# Patient Record
Sex: Male | Born: 1960 | Race: White | Hispanic: No | Marital: Married | Smoking: Current every day smoker
Health system: Southern US, Community
[De-identification: ages and names within clinical notes are randomized; demographics above are authoritative.]

## PROBLEM LIST (undated history)

## (undated) DIAGNOSIS — C801 Malignant (primary) neoplasm, unspecified: Secondary | ICD-10-CM

## (undated) DIAGNOSIS — M542 Cervicalgia: Secondary | ICD-10-CM

## (undated) DIAGNOSIS — F17200 Nicotine dependence, unspecified, uncomplicated: Secondary | ICD-10-CM

## (undated) DIAGNOSIS — G8929 Other chronic pain: Secondary | ICD-10-CM

## (undated) DIAGNOSIS — R51 Headache: Secondary | ICD-10-CM

## (undated) DIAGNOSIS — R519 Headache, unspecified: Secondary | ICD-10-CM

## (undated) DIAGNOSIS — Z95828 Presence of other vascular implants and grafts: Secondary | ICD-10-CM

## (undated) DIAGNOSIS — F419 Anxiety disorder, unspecified: Secondary | ICD-10-CM

## (undated) DIAGNOSIS — J449 Chronic obstructive pulmonary disease, unspecified: Secondary | ICD-10-CM

## (undated) DIAGNOSIS — F410 Panic disorder [episodic paroxysmal anxiety] without agoraphobia: Secondary | ICD-10-CM

## (undated) DIAGNOSIS — F1190 Opioid use, unspecified, uncomplicated: Secondary | ICD-10-CM

## (undated) DIAGNOSIS — M1990 Unspecified osteoarthritis, unspecified site: Secondary | ICD-10-CM

## (undated) HISTORY — PX: FRACTURE SURGERY: SHX138

## (undated) HISTORY — DX: Malignant (primary) neoplasm, unspecified: C80.1

## (undated) HISTORY — PX: NECK SURGERY: SHX720

## (undated) HISTORY — PX: SPLENECTOMY, TOTAL: SHX788

---
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
## 2004-06-16 ENCOUNTER — Encounter: Admission: RE | Admit: 2004-06-16 | Discharge: 2004-09-14 | Payer: Self-pay

## 2010-07-20 ENCOUNTER — Emergency Department (HOSPITAL_COMMUNITY): Admission: EM | Admit: 2010-07-20 | Discharge: 2010-07-20 | Payer: Self-pay | Admitting: Emergency Medicine

## 2010-07-20 DIAGNOSIS — M961 Postlaminectomy syndrome, not elsewhere classified: Secondary | ICD-10-CM | POA: Insufficient documentation

## 2010-11-01 ENCOUNTER — Emergency Department (HOSPITAL_COMMUNITY)
Admission: EM | Admit: 2010-11-01 | Discharge: 2010-11-01 | Payer: Self-pay | Source: Home / Self Care | Admitting: Emergency Medicine

## 2010-12-23 LAB — DIFFERENTIAL
Basophils Absolute: 0.1 10*3/uL (ref 0.0–0.1)
Eosinophils Relative: 2 % (ref 0–5)
Lymphocytes Relative: 41 % (ref 12–46)
Lymphs Abs: 5.8 10*3/uL — ABNORMAL HIGH (ref 0.7–4.0)
Monocytes Absolute: 1.1 10*3/uL — ABNORMAL HIGH (ref 0.1–1.0)
Monocytes Relative: 8 % (ref 3–12)

## 2010-12-23 LAB — BASIC METABOLIC PANEL
BUN: 12 mg/dL (ref 6–23)
Chloride: 108 mEq/L (ref 96–112)
Potassium: 3.9 mEq/L (ref 3.5–5.1)

## 2010-12-23 LAB — CBC
HCT: 42.9 % (ref 39.0–52.0)
Hemoglobin: 14.7 g/dL (ref 13.0–17.0)
MCV: 99.2 fL (ref 78.0–100.0)
RDW: 13.5 % (ref 11.5–15.5)
WBC: 14.1 10*3/uL — ABNORMAL HIGH (ref 4.0–10.5)

## 2010-12-23 LAB — POCT CARDIAC MARKERS: Troponin i, poc: <0.05 ng/mL (ref 0.00–0.09)

## 2011-02-02 DIAGNOSIS — IMO0002 Reserved for concepts with insufficient information to code with codable children: Secondary | ICD-10-CM | POA: Insufficient documentation

## 2011-02-02 DIAGNOSIS — H5440 Blindness, one eye, unspecified eye: Secondary | ICD-10-CM | POA: Insufficient documentation

## 2011-02-02 DIAGNOSIS — Z9081 Acquired absence of spleen: Secondary | ICD-10-CM | POA: Insufficient documentation

## 2011-02-02 DIAGNOSIS — Z79899 Other long term (current) drug therapy: Secondary | ICD-10-CM | POA: Insufficient documentation

## 2011-04-28 DIAGNOSIS — M5137 Other intervertebral disc degeneration, lumbosacral region: Secondary | ICD-10-CM | POA: Insufficient documentation

## 2011-04-28 DIAGNOSIS — M51379 Other intervertebral disc degeneration, lumbosacral region without mention of lumbar back pain or lower extremity pain: Secondary | ICD-10-CM | POA: Insufficient documentation

## 2012-02-14 DIAGNOSIS — W108XXA Fall (on) (from) other stairs and steps, initial encounter: Secondary | ICD-10-CM | POA: Insufficient documentation

## 2012-02-14 DIAGNOSIS — Z79899 Other long term (current) drug therapy: Secondary | ICD-10-CM | POA: Insufficient documentation

## 2012-02-14 DIAGNOSIS — S0003XA Contusion of scalp, initial encounter: Secondary | ICD-10-CM | POA: Insufficient documentation

## 2012-02-15 ENCOUNTER — Emergency Department (HOSPITAL_COMMUNITY): Payer: Medicare Other

## 2012-02-15 ENCOUNTER — Emergency Department (HOSPITAL_COMMUNITY)
Admission: EM | Admit: 2012-02-15 | Discharge: 2012-02-15 | Disposition: A | Discharge status: Home / Self Care | Payer: Medicare Other | Attending: Emergency Medicine | Admitting: Emergency Medicine

## 2012-02-15 ENCOUNTER — Encounter (HOSPITAL_COMMUNITY): Payer: Self-pay | Admitting: *Deleted

## 2012-02-15 DIAGNOSIS — S1093XA Contusion of unspecified part of neck, initial encounter: Secondary | ICD-10-CM

## 2012-02-15 MED ORDER — NAPROXEN 500 MG PO TABS: 500.0000 mg | ORAL_TABLET | Freq: Two times a day (BID) | ORAL | Status: DC

## 2012-02-15 MED ORDER — KETOROLAC TROMETHAMINE 60 MG/2ML IM SOLN
60.0000 mg | Freq: Once | INTRAMUSCULAR | Status: AC
  Administered 2012-02-15: 60 mg via INTRAMUSCULAR
  Filled 2012-02-15: qty 2
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
## 2012-02-15 NOTE — ED Notes (Signed)
Gave patient cup of ice chips as requested.

## 2012-02-15 NOTE — ED Notes (Signed)
RX with patient label provided to patient by Dr Hyacinth Meeker - (electronic printer not working.  )

## 2012-02-15 NOTE — ED Provider Notes (Signed)
History     CSN: [id number]  Arrival date & time 02/14/12  2345   First MD Initiated Contact with Patient 02/15/12 0010      Chief Complaint  Patient presents with  . Fall    (Consider location/radiation/quality/duration/timing/severity/associated sxs/prior treatment) HPI Comments: Neck pain on the L anterior / lateral neck that occurred when he slipped and fell down several steps just pta - pain was acute in onset, constant, moderate and worse with palpation - has no headache, / head injury, change in vision, weakness, numbness, ataxia, back pain or other injuries.  Has been ambulatory without difficulty.  Has hx of prior neck surgery with hardware.  Patient is a 51 y.o. male presenting with fall. The history is provided by the patient and the spouse.  Fall Pertinent negatives include no headaches.    History reviewed. No pertinent past medical history.  Past Surgical History  Procedure Date  . Neck surgery   . Splenectomy, total     No family history on file.  History  Substance Use Topics  . Smoking status: Current Some Day Smoker  . Smokeless tobacco: Not on file  . Alcohol Use: No      Review of Systems  HENT: Positive for neck pain.   Cardiovascular: Negative for chest pain and leg swelling.  Musculoskeletal: Negative for back pain.  Skin: Negative for wound.  Neurological: Negative for headaches.    Allergies  Review of patient's allergies indicates no known allergies.  Home Medications   Current Outpatient Rx  Name Route Sig Dispense Refill  . OXYCONTIN PO Oral Take by mouth.    Marland Kitchen NAPROXEN 500 MG PO TABS Oral Take 1 tablet (500 mg total) by mouth 2 (two) times daily with a meal. 30 tablet 0    BP 148/90  Pulse 86  Temp(Src) 97.8 F (36.6 C) (Oral)  Resp 20  Ht 6\' 1"  (1.854 m)  Wt 175 lb (79.379 kg)  BMI 23.09 kg/m2  SpO2 99%  Physical Exam  Nursing note and vitals reviewed. Constitutional: He appears well-developed and well-nourished. No  distress.  HENT:       NCAT, has mild pain to palpation on the L side of the neck, normal open mouth, no trismus, malocclusion, racoon eyes or battle's sign  Eyes: Conjunctivae are normal. No scleral icterus.  Neck:       No posterior ttp, mild ttp over teh lateral neck / mild bruising present.  No bruit on the L over injury site / vasc site  Cardiovascular: Normal rate, regular rhythm, normal heart sounds and intact distal pulses.   Pulmonary/Chest: Effort normal and breath sounds normal. No respiratory distress. He has no wheezes.  Abdominal: Soft. He exhibits no distension. There is no tenderness.  Musculoskeletal: Normal range of motion. He exhibits tenderness ( only as desxcribed on neck, no back ttp). He exhibits no edema.  Neurological: He is alert. No cranial nerve deficit. Coordination normal.       Normal gait, normal strenght and sensation to the bil UE's.  Skin: Skin is warm and dry. No rash noted. He is not diaphoretic.    ED Course  Procedures (including critical care time)  Labs Reviewed - No data to display Dg Cervical Spine Complete  02/15/2012  *RADIOLOGY REPORT*  Clinical Data: Status post fall; neck pain.  CERVICAL SPINE - COMPLETE 4+ VIEW  Comparison: CT of the cervical spine performed 11/01/2010  Findings: There is no evidence of fracture or subluxation.  The  patient is status post cervical spinal fusion from C3-T1. Vertebral bodies demonstrate normal height and alignment. Intervertebral disc spaces are preserved.  Prevertebral soft tissues are within normal limits.  The provided odontoid view demonstrates no significant abnormality.  The visualized lung apices are clear.  IMPRESSION: No evidence of fracture or subluxation along the cervical spine; status post cervical spinal fusion from C3-T1.  Original Report Authenticated By: Tonia Ghent, M.D.     1. Contusion of neck       MDM  Focal neck injury, no other signs or c/o trauma.  Immobilize with philly c collar,  imaging, toradol IM.  No focal neurologic deficits, normal strength and sensation, x-rays reviewed as above.  X-rays negative, patient will be discharged in stable condition to follow up with family doctor.  Discharge Prescriptions include:  naprosyn  Vida Roller, MD 02/15/12 [phone]

## 2012-02-15 NOTE — ED Notes (Signed)
Pt reports he slipped and fell down some wooden steps tonight "jarring" his neck, reports metal rods already in his neck and now neck pain is worse

## 2012-02-15 NOTE — Discharge Instructions (Signed)

## 2012-04-28 DIAGNOSIS — J189 Pneumonia, unspecified organism: Secondary | ICD-10-CM

## 2012-06-01 ENCOUNTER — Emergency Department (HOSPITAL_COMMUNITY): Payer: Medicare Other

## 2012-06-01 ENCOUNTER — Emergency Department (HOSPITAL_COMMUNITY)
Admission: EM | Admit: 2012-06-01 | Discharge: 2012-06-01 | Disposition: A | Discharge status: Home / Self Care | Payer: Medicare Other | Attending: Emergency Medicine | Admitting: Emergency Medicine

## 2012-06-01 ENCOUNTER — Encounter (HOSPITAL_COMMUNITY): Payer: Self-pay

## 2012-06-01 DIAGNOSIS — R51 Headache: Secondary | ICD-10-CM | POA: Insufficient documentation

## 2012-06-01 DIAGNOSIS — F17200 Nicotine dependence, unspecified, uncomplicated: Secondary | ICD-10-CM | POA: Insufficient documentation

## 2012-06-01 DIAGNOSIS — W108XXA Fall (on) (from) other stairs and steps, initial encounter: Secondary | ICD-10-CM | POA: Insufficient documentation

## 2012-06-01 DIAGNOSIS — M549 Dorsalgia, unspecified: Secondary | ICD-10-CM | POA: Insufficient documentation

## 2012-06-01 DIAGNOSIS — W19XXXA Unspecified fall, initial encounter: Secondary | ICD-10-CM

## 2012-06-01 LAB — URINALYSIS, ROUTINE W REFLEX MICROSCOPIC
Bilirubin Urine: NEGATIVE
Glucose, UA: NEGATIVE mg/dL
Hgb urine dipstick: NEGATIVE
Ketones, ur: NEGATIVE mg/dL
Protein, ur: NEGATIVE mg/dL

## 2012-06-01 LAB — RAPID URINE DRUG SCREEN, HOSP PERFORMED
Amphetamines: NOT DETECTED
Benzodiazepines: NOT DETECTED
Cocaine: NOT DETECTED
Opiates: POSITIVE — AB

## 2012-06-01 MED ORDER — KETOROLAC TROMETHAMINE 60 MG/2ML IM SOLN
60.0000 mg | Freq: Once | INTRAMUSCULAR | Status: AC
  Administered 2012-06-01: 60 mg via INTRAMUSCULAR
  Filled 2012-06-01: qty 2

## 2012-06-01 NOTE — ED Notes (Signed)
Patients upset about treatment received at this facility.  Advised MD has informed them the xrays and CT do not show a fracture, and he is advised to use anti-inflammatory and follow up with his family doctors  Zachary Dickerson or Dr Lacie Scotts.

## 2012-06-01 NOTE — ED Provider Notes (Signed)
History     CSN: [id number]  Arrival date & time 06/01/12  1649   First MD Initiated Contact with Patient 06/01/12 1702      Chief Complaint  Patient presents with  . Fall    (Consider location/radiation/quality/duration/timing/severity/associated sxs/prior treatment) HPI Patient complains of diffuse back pain after falling down 18 steps 2 days ago. He denies loss of consciousness. He denies any numbness or tingling. He denies any injury to his chest abdomen or extremities. He states there is bruising and swelling in the mid back area. History reviewed. No pertinent past medical history.  Past Surgical History  Procedure Date  . Neck surgery   . Splenectomy, total     No family history on file.  History  Substance Use Topics  . Smoking status: Current Some Day Smoker  . Smokeless tobacco: Not on file  . Alcohol Use: No      Review of Systems  All other systems reviewed and are negative.    Allergies  Review of patient's allergies indicates no known allergies.  Home Medications   Current Outpatient Rx  Name Route Sig Dispense Refill  . NAPROXEN 500 MG PO TABS Oral Take 1 tablet (500 mg total) by mouth 2 (two) times daily with a meal. 30 tablet 0  . OXYCONTIN PO Oral Take by mouth.      BP 129/65  Pulse 98  Temp 98.1 F (36.7 C) (Oral)  Resp 22  Ht 6' (1.829 m)  Wt 166 lb (75.297 kg)  BMI 22.51 kg/m2  SpO2 95%  Physical Exam  Nursing note and vitals reviewed. Constitutional: He is oriented to person, place, and time. He appears well-developed and well-nourished.  HENT:  Head: Normocephalic and atraumatic.  Right Ear: External ear normal.  Left Ear: External ear normal.  Nose: Nose normal.  Mouth/Throat: Oropharynx is clear and moist.  Eyes: Conjunctivae and EOM are normal. Pupils are equal, round, and reactive to light.  Neck: Normal range of motion. Neck supple.       Collar removed and exam is normal after x-rays.  Cardiovascular: Normal rate,  regular rhythm, normal heart sounds and intact distal pulses.   Pulmonary/Chest: Effort normal and breath sounds normal.  Abdominal: Soft. Bowel sounds are normal.  Musculoskeletal: Normal range of motion.       Diffuse tenderness to palpation throughout thoracic and lumbar back. Neck is mildly diffusely tender. Collar is in place. No contusion or swelling is noted on my exam.  Neurological: He is alert and oriented to person, place, and time. He has normal reflexes.       Patient has slurring of words. His strength is 5 out of 5 throughout. He is oriented that when I ask him direct questions but causes his eyes in between.  Skin: Skin is warm and dry.  Psychiatric: He has a normal mood and affect. His behavior is normal. Thought content normal.    ED Course  Procedures (including critical care time)  Labs Reviewed  URINE RAPID DRUG SCREEN (HOSP PERFORMED) - Abnormal; Notable for the following:    Opiates POSITIVE (*)     All other components within normal limits  URINALYSIS, ROUTINE W REFLEX MICROSCOPIC - Abnormal; Notable for the following:    Specific Gravity, Urine <1.005 (*)     All other components within normal limits   Dg Thoracic Spine 2 View  06/01/2012  *RADIOLOGY REPORT*  Clinical Data: Larey Seat.  Back pain.  THORACIC SPINE - 2 VIEW  Comparison:  07/16/2010.  Findings: Cervical fusion hardware is noted.  This extends down to T1.  Normal alignment of the thoracic vertebral bodies is noted. There is a stable mid thoracic compression fracture.  No new/acute fracture.  IMPRESSION:  1.  Normal alignment and no acute bony findings. 2.  Remote mid thoracic vertebral body compression fracture.   Original Report Authenticated By: P. Loralie Champagne, M.D.    Dg Lumbar Spine Complete  06/01/2012  *RADIOLOGY REPORT*  Clinical Data: Larey Seat.  Back pain.  LUMBAR SPINE - COMPLETE 4+ VIEW  Comparison: 11/01/2010.  Findings: The lateral film demonstrates normal alignment. Vertebral bodies and disc spaces  are maintained except for mild to moderate degenerative disc disease at L5-S1.  No acute bony findings.  Normal alignment of the facet joints and no pars defects.  The visualized bony pelvis in intact.  Aortic calcifications are noted.  IMPRESSION: Normal alignment and no acute bony findings. Moderate degenerative disc disease at L5-S1.   Original Report Authenticated By: P. Loralie Champagne, M.D.    Ct Head Wo Contrast  06/01/2012  *RADIOLOGY REPORT*  Clinical Data:  51 year old male status post fall down steps. Pain.  History of cervical spine surgery.  CT HEAD WITHOUT CONTRAST CT CERVICAL SPINE WITHOUT CONTRAST  Technique:  Multidetector CT imaging of the head and cervical spine was performed following the standard protocol without intravenous contrast.  Multiplanar CT image reconstructions of the cervical spine were also generated.  Comparison:  [facility] 04/28/2012 and earlier.  CT HEAD  Findings: Chronic paranasal sinus opacification and mucosal thickening.  Left maxillary superimposed mucous retention cyst. Mastoids are clear.  No focal scalp hematoma. Visualized orbit soft tissues are within normal limits.  Chronic right posterior zygomatic arch fracture. Calvarium intact.  Stable cerebral volume.  No ventriculomegaly. No midline shift, mass effect, or evidence of mass lesion.  No evidence of cortically based acute infarction identified.  No acute intracranial hemorrhage identified.  No suspicious intracranial vascular hyperdensity.  IMPRESSION: 1.  No acute traumatic injury to the brain. 2.  Cervical findings below.  CT CERVICAL SPINE  Findings: Previous cervical decompression and fusion from C3-C4 to C7-T1.  Stable cervical vertebral height and alignment. Visualized skull base is intact.  No atlanto-occipital dissociation. Cervicothoracic junction alignment is within normal limits. Posterior elements from C3 to T1 are normally aligned and solidly fused.  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  Stable evidence of mild spinal stenosis with mild mass effect on the spinal cord at this level (estimated AP thecal sac 7 mm).  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  No acute cervical fracture.  Hardware appears stable and intact.  Lung apices are clear. Right apex paraseptal emphysema.  Chronic right clavicle deformity. Visualized paraspinal soft tissues are within normal limits.  IMPRESSION: 1. No acute fracture or listhesis identified in the cervical spine. Ligamentous injury is not excluded. 2. 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   Original Report Authenticated By: Harley Hallmark, M.D.    Ct Cervical Spine Wo Contrast  06/01/2012  *RADIOLOGY REPORT*  Clinical Data:  51 year old male status post fall down steps. Pain.  History of cervical spine surgery.  CT HEAD WITHOUT CONTRAST CT CERVICAL SPINE WITHOUT CONTRAST  Technique:  Multidetector CT imaging of the head and cervical spine was performed following the standard protocol without intravenous contrast.  Multiplanar CT image reconstructions  of the cervical spine were also generated.  Comparison:  [facility] 04/28/2012 and earlier.  CT HEAD  Findings: Chronic paranasal sinus opacification and mucosal thickening.  Left maxillary superimposed mucous retention cyst. Mastoids are clear.  No focal scalp hematoma. Visualized orbit soft tissues are within normal limits.  Chronic right posterior zygomatic arch fracture. Calvarium intact.  Stable cerebral volume.  No ventriculomegaly. No midline shift, mass effect, or evidence of mass lesion.  No evidence of cortically based acute infarction identified.  No acute intracranial hemorrhage identified.  No suspicious intracranial vascular hyperdensity.  IMPRESSION: 1.  No acute traumatic injury to the brain. 2.  Cervical findings below.   CT CERVICAL SPINE  Findings: Previous cervical decompression and fusion from C3-C4 to C7-T1.  Stable cervical vertebral height and alignment. Visualized skull base is intact.  No atlanto-occipital dissociation. Cervicothoracic junction alignment is within normal limits. Posterior elements from C3 to T1 are normally aligned and solidly fused.  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  Stable evidence of mild spinal stenosis with mild mass effect on the spinal cord at this level (estimated AP thecal sac 7 mm).  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  No acute cervical fracture.  Hardware appears stable and intact.  Lung apices are clear. Right apex paraseptal emphysema.  Chronic right clavicle deformity. Visualized paraspinal soft tissues are within normal limits.  IMPRESSION: 1. No acute fracture or listhesis identified in the cervical spine. Ligamentous injury is not excluded. 2. 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   Original Report Authenticated By: Harley Hallmark, M.D.      No diagnosis found.    MDM  Patient is taking Xanax and OxyContin. He is given Toradol for pain here. Advised he can use ibuprofen for further pain management.        Hilario Quarry, MD 06/01/12 [phone]

## 2012-06-01 NOTE — ED Notes (Signed)
Pt states he was going down steps, he slipped and fell 18 steps. Complain of pain from neck down back. Collar applied

## 2013-04-23 DIAGNOSIS — R079 Chest pain, unspecified: Secondary | ICD-10-CM

## 2013-07-22 ENCOUNTER — Emergency Department (HOSPITAL_COMMUNITY)
Admission: EM | Admit: 2013-07-22 | Discharge: 2013-07-22 | Disposition: A | Discharge status: Home / Self Care | Payer: Medicare Other | Attending: Emergency Medicine | Admitting: Emergency Medicine

## 2013-07-22 ENCOUNTER — Encounter (HOSPITAL_COMMUNITY): Payer: Self-pay | Admitting: Emergency Medicine

## 2013-07-22 DIAGNOSIS — F410 Panic disorder [episodic paroxysmal anxiety] without agoraphobia: Secondary | ICD-10-CM | POA: Insufficient documentation

## 2013-07-22 DIAGNOSIS — F17200 Nicotine dependence, unspecified, uncomplicated: Secondary | ICD-10-CM | POA: Insufficient documentation

## 2013-07-22 DIAGNOSIS — M542 Cervicalgia: Secondary | ICD-10-CM | POA: Insufficient documentation

## 2013-07-22 DIAGNOSIS — Z79899 Other long term (current) drug therapy: Secondary | ICD-10-CM | POA: Insufficient documentation

## 2013-07-22 DIAGNOSIS — G8929 Other chronic pain: Secondary | ICD-10-CM | POA: Insufficient documentation

## 2013-07-22 HISTORY — DX: Anxiety disorder, unspecified: F41.9

## 2013-07-22 HISTORY — DX: Panic disorder (episodic paroxysmal anxiety): F41.0

## 2013-07-22 HISTORY — DX: Cervicalgia: M54.2

## 2013-07-22 HISTORY — DX: Other chronic pain: G89.29

## 2013-07-22 MED ORDER — METHOCARBAMOL 500 MG PO TABS
1000.0000 mg | ORAL_TABLET | Freq: Once | ORAL | Status: AC
  Administered 2013-07-22: 1000 mg via ORAL
  Filled 2013-07-22: qty 2

## 2013-07-22 MED ORDER — HYDROCODONE-ACETAMINOPHEN 5-325 MG PO TABS
2.0000 | ORAL_TABLET | Freq: Once | ORAL | Status: AC
  Administered 2013-07-22: 2 via ORAL
  Filled 2013-07-22: qty 2

## 2013-07-22 MED ORDER — KETOROLAC TROMETHAMINE 10 MG PO TABS
10.0000 mg | ORAL_TABLET | Freq: Once | ORAL | Status: AC
  Administered 2013-07-22: 10 mg via ORAL
  Filled 2013-07-22: qty 1

## 2013-07-22 NOTE — ED Notes (Signed)
Pt reports chronic neck pain, pain is worse today, denies any new or recent injury. Has no meds for pain, pt is taking xanax for his pain, pt w/ slurred speech in triage, stated he took 2 xanax pta.
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
## 2013-07-22 NOTE — ED Provider Notes (Signed)
Medical screening examination/treatment/procedure(s) were performed by non-physician practitioner and as supervising physician I was immediately available for consultation/collaboration.   Argie Applegate W Travon Crochet, MD 07/22/13 2347 

## 2015-11-09 ENCOUNTER — Ambulatory Visit (HOSPITAL_COMMUNITY): Payer: Medicare Other | Admitting: Psychology

## 2018-05-20 ENCOUNTER — Emergency Department (HOSPITAL_COMMUNITY)
Admission: EM | Admit: 2018-05-20 | Discharge: 2018-05-20 | Disposition: A | Discharge status: Home / Self Care | Payer: Medicare HMO | Attending: Emergency Medicine | Admitting: Emergency Medicine

## 2018-05-20 ENCOUNTER — Other Ambulatory Visit: Payer: Self-pay

## 2018-05-20 ENCOUNTER — Emergency Department (HOSPITAL_COMMUNITY): Payer: Medicare HMO

## 2018-05-20 ENCOUNTER — Encounter (HOSPITAL_COMMUNITY): Payer: Self-pay | Admitting: Emergency Medicine

## 2018-05-20 DIAGNOSIS — Z79899 Other long term (current) drug therapy: Secondary | ICD-10-CM | POA: Insufficient documentation

## 2018-05-20 DIAGNOSIS — R221 Localized swelling, mass and lump, neck: Secondary | ICD-10-CM

## 2018-05-20 DIAGNOSIS — F17210 Nicotine dependence, cigarettes, uncomplicated: Secondary | ICD-10-CM | POA: Insufficient documentation

## 2018-05-20 LAB — COMPREHENSIVE METABOLIC PANEL
ALBUMIN: 3.2 g/dL — AB (ref 3.5–5.0)
ALT: 12 U/L (ref 0–44)
ANION GAP: 7 (ref 5–15)
AST: 20 U/L (ref 15–41)
Alkaline Phosphatase: 111 U/L (ref 38–126)
BILIRUBIN TOTAL: 0.3 mg/dL (ref 0.3–1.2)
BUN: 27 mg/dL — ABNORMAL HIGH (ref 6–20)
CALCIUM: 9.2 mg/dL (ref 8.9–10.3)
CHLORIDE: 106 mmol/L (ref 98–111)
CO2: 23 mmol/L (ref 22–32)
CREATININE: 1.16 mg/dL (ref 0.61–1.24)
GFR calc Af Amer: >60 mL/min (ref >60)
GLUCOSE: 133 mg/dL — AB (ref 70–99)
POTASSIUM: 4.4 mmol/L (ref 3.5–5.1)
Sodium: 136 mmol/L (ref 135–145)
Total Protein: 7.2 g/dL (ref 6.5–8.1)

## 2018-05-20 MED ORDER — HYDROCODONE-ACETAMINOPHEN 5-325 MG PO TABS: 1.0000 | ORAL_TABLET | Freq: Four times a day (QID) | ORAL | 0 refills | Status: DC | PRN

## 2018-05-20 MED ORDER — NICOTINE 14 MG/24HR TD PT24
14.0000 mg | MEDICATED_PATCH | Freq: Once | TRANSDERMAL | Status: DC
  Administered 2018-05-20: 14 mg via TRANSDERMAL
  Filled 2018-05-20: qty 1

## 2018-05-20 MED ORDER — HYDROCODONE-ACETAMINOPHEN 5-325 MG PO TABS
1.0000 | ORAL_TABLET | Freq: Once | ORAL | Status: AC
  Administered 2018-05-20: 1 via ORAL
  Filled 2018-05-20: qty 1

## 2018-05-20 NOTE — ED Notes (Signed)
EDP at bedside  

## 2018-05-20 NOTE — ED Triage Notes (Signed)
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 The paperwork states to go to [facility] ED for further evaluation and treatment.

## 2018-05-20 NOTE — Discharge Instructions (Signed)
The CAT scan findings from the CT scan done at [facility] discussed with Dr. Benjamine Mola.  Call his office on Monday and set up an appointment.  The appointment can be made either in the [place] office or in his main office.  It may be more convenient to set up the appointment in the [place] office.  Take the hydrocodone as needed for pain.

## 2018-05-20 NOTE — ED Notes (Signed)
Pt did not want a hall bed

## 2018-05-20 NOTE — ED Notes (Signed)
Patient verbalizes understanding of discharge instructions. Opportunity for questioning and answers were provided. Armband removed by staff, pt discharged from ED.  

## 2018-05-20 NOTE — ED Notes (Signed)
Patient transported to X-ray 

## 2018-05-20 NOTE — ED Provider Notes (Signed)
[facility] EMERGENCY DEPARTMENT Provider Note   CSN: [id number] Arrival date & time: 05/20/18  0756     History   Chief Complaint Chief Complaint  Patient presents with  . Cancer, new diagnosis    HPI Zachary Dickerson is a 57 y.o. male.  Patient with a complaint of a neck mass on the right side for several months he thought was secondary to bee sting.  But it never really resolved.  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  Patient had basic lab work CBC and basic metabolic panel.  It appears that work-up was not done patient left AMA prior to completion of the work-up.  May have become scared by the fact that they thought that they had found cancer.  Patient is here today for additional work-up.  Patient states there is some sore throat he has some neck pain mostly on the right side.  Little bit on the left.  No headache no fevers no trouble breathing no abdominal pain.  No chest pain.     Past Medical History:  Diagnosis Date  . Anxiety   . Chronic neck pain   . Panic attack     There are no active problems to display for this patient.   Past Surgical History:  Procedure Laterality Date  . FRACTURE SURGERY    . NECK SURGERY    . NECK SURGERY    . SPLENECTOMY, TOTAL          Home Medications    Prior to Admission medications   Medication Sig Start Date End Date Taking? Authorizing Provider  ALPRAZolam Duanne Moron) 1 MG tablet Take 1 mg by mouth 3 (three) times daily.    Yes [provider]  Aspirin-Salicylamide-Caffeine (BC HEADACHE) 325-95-16 MG TABS Take 1-2 packets by mouth daily as needed (for pain).   Yes [provider]  buprenorphine (SUBUTEX) 8 MG SUBL SL tablet Place 8 mg under the tongue 3 (three) times daily before meals. 04/24/18  Yes [provider]    gabapentin (NEURONTIN) 300 MG capsule Take 300 capsules by mouth 3 (three) times daily. 04/25/18  Yes [provider]  QUEtiapine (SEROQUEL) 25 MG tablet Take 25 mg by mouth at bedtime.  07/09/13  Yes [provider]  HYDROcodone-acetaminophen (NORCO/VICODIN) 5-325 MG tablet Take 1 tablet by mouth every 6 (six) hours as needed for moderate pain. 05/20/18   Fredia Sorrow, MD    Family History No family history on file.  Social History Social History   Tobacco Use  . Smoking status: Current Some Day Smoker    Types: Cigarettes  Substance Use Topics  . Alcohol use: No  . Drug use: No     Allergies   Patient has no known allergies.   Review of Systems Review of Systems  Constitutional: Negative for chills and fever.  HENT: Positive for facial swelling and sore throat. Negative for ear pain, nosebleeds, trouble swallowing and voice change.   Eyes: Negative for visual disturbance.  Respiratory: Negative for shortness of breath.   Cardiovascular: Negative for chest pain.  Gastrointestinal: Negative for abdominal pain.  Musculoskeletal: Positive for neck pain.  Skin: Negative for rash.  Neurological: Negative for syncope and headaches.  Hematological: Does not bruise/bleed easily.  Psychiatric/Behavioral: Positive for confusion.     Physical Exam Updated Vital Signs BP 123/75  Pulse 90   Temp 97.6 F (36.4 C) (Oral)   Resp 14   SpO2 93%   Physical Exam  Constitutional: He is oriented to person, place, and time. He appears well-developed and well-nourished. No distress.  HENT:  Head: Normocephalic and atraumatic.  Mouth/Throat: Oropharynx is clear and moist.  Eyes: Pupils are equal, round, and reactive to light. Conjunctivae and EOM are normal.  Neck:  Bilateral adenopathy greater on the right posterior cervical change with a mass measuring about 3 x 5 cm.  Cardiovascular: Normal rate, regular rhythm and normal heart sounds.  Pulmonary/Chest:  Effort normal and breath sounds normal.  Abdominal: Soft. Bowel sounds are normal. There is no tenderness.  Musculoskeletal: Normal range of motion. He exhibits no edema.  Lymphadenopathy:    He has cervical adenopathy.  Neurological: He is alert and oriented to person, place, and time. No cranial nerve deficit or sensory deficit. He exhibits normal muscle tone. Coordination normal.  Questionable memory abnormality.  Skin: Skin is warm.  Nursing note and vitals reviewed.    ED Treatments / Results  Labs (all labs ordered are listed, but only abnormal results are displayed) Labs Reviewed  COMPREHENSIVE METABOLIC PANEL - Abnormal; Notable for the following components:      Result Value   Glucose, Bld 133 (*)    BUN 27 (*)    Albumin 3.2 (*)    All other components within normal limits    EKG None  Radiology Dg Chest 2 View  Result Date: 05/20/2018 CLINICAL DATA:  Bilateral neck swelling for the past month. EXAM: CHEST - 2 VIEW COMPARISON:  Thoracic spine dated 07/19/2016 and chest radiographs dated 06/18/2011. FINDINGS: Normal sized heart. Clear lungs. The lungs remain hyperexpanded with mild diffuse peribronchial thickening and accentuation of the interstitial markings. Cervical spine fixation hardware. Stable previously described T8 vertebral body compression deformity. IMPRESSION: Stable changes of COPD and chronic bronchitis. No acute abnormality. Electronically Signed   By: Claudie Revering M.D.   On: 05/20/2018 10:49    Procedures Procedures (including critical care time)  Medications Ordered in ED Medications  nicotine (NICODERM CQ - dosed in mg/24 hours) patch 14 mg (14 mg Transdermal Patch Applied 05/20/18 1058)  HYDROcodone-acetaminophen (NORCO/VICODIN) 5-325 MG per tablet 1 tablet (1 tablet Oral Given 05/20/18 1123)     Initial Impression / Assessment and Plan / ED Course  I have reviewed the triage vital signs and the nursing notes.  Pertinent labs & imaging results  that were available during my care of the patient were reviewed by me and considered in my medical decision making (see chart for details).     Patient CT scan soft tissue neck with contrast findings discussed with ear nose and throat doctor Teoh.  Most consistent with metastatic nasopharyngeal cancer.  He will see him in his office and do a biopsy we will make referrals to radiation oncology and oncology as needed.  Patient pain improved here with hydrocodone.  New prescription provided for that.  Chest x-ray without any significant findings liver function test are normal.  Labs were done yesterday at [facility] also without significant abnormalities.  Final Clinical Impressions(s) / ED Diagnoses   Final diagnoses:  Mass of lateral neck    ED Discharge Orders         Ordered    HYDROcodone-acetaminophen (NORCO/VICODIN) 5-325 MG tablet  Every 6 hours PRN     05/20/18 [place], [place],  MD 05/20/18 1342

## 2018-05-21 ENCOUNTER — Ambulatory Visit (INDEPENDENT_AMBULATORY_CARE_PROVIDER_SITE_OTHER): Payer: Medicare HMO | Admitting: Otolaryngology

## 2018-05-21 ENCOUNTER — Other Ambulatory Visit: Payer: Self-pay | Admitting: Otolaryngology

## 2018-05-21 DIAGNOSIS — D3705 Neoplasm of uncertain behavior of pharynx: Secondary | ICD-10-CM

## 2018-05-21 DIAGNOSIS — R490 Dysphonia: Secondary | ICD-10-CM

## 2018-05-21 DIAGNOSIS — R590 Localized enlarged lymph nodes: Secondary | ICD-10-CM | POA: Diagnosis not present

## 2018-05-22 ENCOUNTER — Other Ambulatory Visit: Payer: Self-pay

## 2018-05-22 ENCOUNTER — Encounter (HOSPITAL_BASED_OUTPATIENT_CLINIC_OR_DEPARTMENT_OTHER): Payer: Self-pay | Admitting: *Deleted

## 2018-05-22 NOTE — Anesthesia Preprocedure Evaluation (Addendum)
Anesthesia Evaluation  Patient identified by MRN, date of birth, ID band Patient awake    Reviewed: Allergy & Precautions, NPO status , Patient's Chart, lab work & pertinent test results  Airway Mallampati: II  TM Distance: >3 FB Neck ROM: Full    Dental no notable dental hx. (+) Edentulous Upper, Edentulous Lower   Pulmonary COPD, Current Smoker,    Pulmonary exam normal breath sounds clear to auscultation       Cardiovascular Exercise Tolerance: Good negative cardio ROS Normal cardiovascular exam Rhythm:Regular Rate:Normal     Neuro/Psych negative neurological ROS  negative psych ROS   GI/Hepatic negative GI ROS, Neg liver ROS,   Endo/Other    Renal/GU negative Renal ROS     Musculoskeletal   Abdominal   Peds  Hematology   Anesthesia Other Findings   Reproductive/Obstetrics                            Anesthesia Physical Anesthesia Plan  ASA: III  Anesthesia Plan: General   Post-op Pain Management:    Induction: Intravenous  PONV Risk Score and Plan: 3 and Treatment may vary due to age or medical condition, Ondansetron and Dexamethasone  Airway Management Planned: Oral ETT  Additional Equipment:   Intra-op Plan:   Post-operative Plan: Extubation in OR  Informed Consent: I have reviewed the patients History and Physical, chart, labs and discussed the procedure including the risks, benefits and alternatives for the proposed anesthesia with the patient or authorized representative who has indicated his/her understanding and acceptance.   Dental advisory given  Plan Discussed with: CRNA  Anesthesia Plan Comments:        Anesthesia Quick Evaluation

## 2018-05-23 ENCOUNTER — Encounter (HOSPITAL_BASED_OUTPATIENT_CLINIC_OR_DEPARTMENT_OTHER)
Admission: RE | Disposition: A | Discharge status: Home / Self Care | Payer: Self-pay | Source: Ambulatory Visit | Attending: Otolaryngology

## 2018-05-23 ENCOUNTER — Encounter (HOSPITAL_BASED_OUTPATIENT_CLINIC_OR_DEPARTMENT_OTHER): Payer: Self-pay | Admitting: Anesthesiology

## 2018-05-23 ENCOUNTER — Other Ambulatory Visit: Payer: Self-pay

## 2018-05-23 ENCOUNTER — Ambulatory Visit (HOSPITAL_BASED_OUTPATIENT_CLINIC_OR_DEPARTMENT_OTHER): Payer: Medicare HMO | Admitting: Anesthesiology

## 2018-05-23 ENCOUNTER — Ambulatory Visit (HOSPITAL_BASED_OUTPATIENT_CLINIC_OR_DEPARTMENT_OTHER)
Admission: RE | Admit: 2018-05-23 | Discharge: 2018-05-23 | Disposition: A | Discharge status: Home / Self Care | Payer: Medicare HMO | Source: Ambulatory Visit | Attending: Otolaryngology | Admitting: Otolaryngology

## 2018-05-23 DIAGNOSIS — Z79899 Other long term (current) drug therapy: Secondary | ICD-10-CM | POA: Diagnosis not present

## 2018-05-23 DIAGNOSIS — J449 Chronic obstructive pulmonary disease, unspecified: Secondary | ICD-10-CM | POA: Diagnosis not present

## 2018-05-23 DIAGNOSIS — F419 Anxiety disorder, unspecified: Secondary | ICD-10-CM | POA: Diagnosis not present

## 2018-05-23 DIAGNOSIS — C119 Malignant neoplasm of nasopharynx, unspecified: Secondary | ICD-10-CM | POA: Insufficient documentation

## 2018-05-23 DIAGNOSIS — D3705 Neoplasm of uncertain behavior of pharynx: Secondary | ICD-10-CM

## 2018-05-23 DIAGNOSIS — F17210 Nicotine dependence, cigarettes, uncomplicated: Secondary | ICD-10-CM | POA: Diagnosis not present

## 2018-05-23 HISTORY — PX: NASOPHARYNGEAL BIOPSY: SHX6488

## 2018-05-23 HISTORY — DX: Nicotine dependence, unspecified, uncomplicated: F17.200

## 2018-05-23 HISTORY — DX: Unspecified osteoarthritis, unspecified site: M19.90

## 2018-05-23 HISTORY — DX: Chronic obstructive pulmonary disease, unspecified: J44.9

## 2018-05-23 HISTORY — DX: Opioid use, unspecified, uncomplicated: F11.90

## 2018-05-23 SURGERY — BIOPSY, NASOPHARYNX
Anesthesia: General | Site: Mouth

## 2018-05-23 MED ORDER — SCOPOLAMINE 1 MG/3DAYS TD PT72: 1.0000 | MEDICATED_PATCH | Freq: Once | TRANSDERMAL | Status: DC | PRN

## 2018-05-23 MED ORDER — HYDROCODONE-ACETAMINOPHEN 5-325 MG PO TABS: 1.0000 | ORAL_TABLET | Freq: Four times a day (QID) | ORAL | 0 refills | Status: DC | PRN

## 2018-05-23 MED ORDER — ROCURONIUM BROMIDE 100 MG/10ML IV SOLN
INTRAVENOUS | Status: DC | PRN
  Administered 2018-05-23: 45 mg via INTRAVENOUS

## 2018-05-23 MED ORDER — HYDROCODONE-ACETAMINOPHEN 7.5-325 MG PO TABS: 1.0000 | ORAL_TABLET | Freq: Once | ORAL | Status: DC | PRN

## 2018-05-23 MED ORDER — FENTANYL CITRATE (PF) 100 MCG/2ML IJ SOLN
50.0000 ug | INTRAMUSCULAR | Status: DC | PRN
  Administered 2018-05-23: 100 ug via INTRAVENOUS

## 2018-05-23 MED ORDER — DEXAMETHASONE SODIUM PHOSPHATE 4 MG/ML IJ SOLN
INTRAMUSCULAR | Status: DC | PRN
  Administered 2018-05-23: 10 mg via INTRAVENOUS

## 2018-05-23 MED ORDER — BUPIVACAINE HCL (PF) 0.25 % IJ SOLN
INTRAMUSCULAR | Status: AC
  Filled 2018-05-23: qty 90

## 2018-05-23 MED ORDER — LIDOCAINE 2% (20 MG/ML) 5 ML SYRINGE
INTRAMUSCULAR | Status: AC
  Filled 2018-05-23: qty 5

## 2018-05-23 MED ORDER — SUGAMMADEX SODIUM 200 MG/2ML IV SOLN
INTRAVENOUS | Status: DC | PRN
  Administered 2018-05-23: 200 mg via INTRAVENOUS

## 2018-05-23 MED ORDER — OXYMETAZOLINE HCL 0.05 % NA SOLN
NASAL | Status: AC
  Filled 2018-05-23: qty 15

## 2018-05-23 MED ORDER — MEPERIDINE HCL 25 MG/ML IJ SOLN: 6.2500 mg | INTRAMUSCULAR | Status: DC | PRN

## 2018-05-23 MED ORDER — MIDAZOLAM HCL 2 MG/2ML IJ SOLN
INTRAMUSCULAR | Status: AC
  Filled 2018-05-23: qty 2

## 2018-05-23 MED ORDER — ONDANSETRON HCL 4 MG/2ML IJ SOLN: 4.0000 mg | Freq: Once | INTRAMUSCULAR | Status: DC | PRN

## 2018-05-23 MED ORDER — MIDAZOLAM HCL 2 MG/2ML IJ SOLN
1.0000 mg | INTRAMUSCULAR | Status: DC | PRN
  Administered 2018-05-23: 2 mg via INTRAVENOUS

## 2018-05-23 MED ORDER — GABAPENTIN 300 MG PO CAPS: 300.0000 mg | ORAL_CAPSULE | Freq: Three times a day (TID) | ORAL | 0 refills | Status: DC

## 2018-05-23 MED ORDER — OXYMETAZOLINE HCL 0.05 % NA SOLN
NASAL | Status: DC | PRN
  Administered 2018-05-23: 1

## 2018-05-23 MED ORDER — METHYLENE BLUE 0.5 % INJ SOLN
INTRAVENOUS | Status: AC
  Filled 2018-05-23: qty 10

## 2018-05-23 MED ORDER — PROPOFOL 10 MG/ML IV BOLUS
INTRAVENOUS | Status: DC | PRN
  Administered 2018-05-23: 200 mg via INTRAVENOUS

## 2018-05-23 MED ORDER — PROPOFOL 10 MG/ML IV BOLUS
INTRAVENOUS | Status: AC
  Filled 2018-05-23: qty 20

## 2018-05-23 MED ORDER — LIDOCAINE 2% (20 MG/ML) 5 ML SYRINGE
INTRAMUSCULAR | Status: DC | PRN
  Administered 2018-05-23: 100 mg via INTRAVENOUS

## 2018-05-23 MED ORDER — CEFAZOLIN SODIUM-DEXTROSE 2-3 GM-%(50ML) IV SOLR
INTRAVENOUS | Status: DC | PRN
  Administered 2018-05-23: 2 g via INTRAVENOUS

## 2018-05-23 MED ORDER — GABAPENTIN 300 MG PO CAPS: 300.0000 mg | ORAL_CAPSULE | Freq: Three times a day (TID) | ORAL | Status: DC

## 2018-05-23 MED ORDER — FENTANYL CITRATE (PF) 100 MCG/2ML IJ SOLN
INTRAMUSCULAR | Status: AC
  Filled 2018-05-23: qty 2

## 2018-05-23 MED ORDER — ACETAMINOPHEN 10 MG/ML IV SOLN: 1000.0000 mg | Freq: Once | INTRAVENOUS | Status: DC | PRN

## 2018-05-23 MED ORDER — SODIUM CHLORIDE 0.9 % IJ SOLN
INTRAMUSCULAR | Status: AC
  Filled 2018-05-23: qty 10

## 2018-05-23 MED ORDER — ONDANSETRON HCL 4 MG/2ML IJ SOLN
INTRAMUSCULAR | Status: DC | PRN
  Administered 2018-05-23: 4 mg via INTRAVENOUS

## 2018-05-23 MED ORDER — HYDROMORPHONE HCL 1 MG/ML IJ SOLN: 0.2500 mg | INTRAMUSCULAR | Status: DC | PRN

## 2018-05-23 MED ORDER — LACTATED RINGERS IV SOLN
INTRAVENOUS | Status: DC
  Administered 2018-05-23 (×2): via INTRAVENOUS

## 2018-05-23 SURGICAL SUPPLY — 30 items
CANISTER SUCT 1200ML W/VALVE (MISCELLANEOUS) ×3 IMPLANT
CATH ROBINSON RED A/P 10FR (CATHETERS) ×3 IMPLANT
CATH ROBINSON RED A/P 14FR (CATHETERS) IMPLANT
COAGULATOR SUCT 6 FR SWTCH (ELECTROSURGICAL)
COAGULATOR SUCT SWTCH 10FR 6 (ELECTROSURGICAL) IMPLANT
COVER BACK TABLE 60X90IN (DRAPES) ×3 IMPLANT
COVER MAYO STAND STRL (DRAPES) ×3 IMPLANT
ELECT REM PT RETURN 9FT ADLT (ELECTROSURGICAL)
ELECT REM PT RETURN 9FT PED (ELECTROSURGICAL)
ELECTRODE REM PT RETRN 9FT PED (ELECTROSURGICAL) IMPLANT
ELECTRODE REM PT RTRN 9FT ADLT (ELECTROSURGICAL) IMPLANT
GAUZE SPONGE 4X4 12PLY STRL LF (GAUZE/BANDAGES/DRESSINGS) ×3 IMPLANT
GLOVE BIO SURGEON STRL SZ7.5 (GLOVE) ×3 IMPLANT
GLOVE BIOGEL M STRL SZ7.5 (GLOVE) ×3 IMPLANT
GOWN STRL REUS W/ TWL LRG LVL3 (GOWN DISPOSABLE) ×1 IMPLANT
GOWN STRL REUS W/ TWL XL LVL3 (GOWN DISPOSABLE) ×1 IMPLANT
GOWN STRL REUS W/TWL LRG LVL3 (GOWN DISPOSABLE) ×2
GOWN STRL REUS W/TWL XL LVL3 (GOWN DISPOSABLE) ×2
MARKER SKIN DUAL TIP RULER LAB (MISCELLANEOUS) IMPLANT
NS IRRIG 1000ML POUR BTL (IV SOLUTION) ×3 IMPLANT
SHEET MEDIUM DRAPE 40X70 STRL (DRAPES) ×3 IMPLANT
SOLUTION BUTLER CLEAR DIP (MISCELLANEOUS) ×3 IMPLANT
SPONGE TONSIL TAPE 1 RFD (DISPOSABLE) IMPLANT
SPONGE TONSIL TAPE 1.25 RFD (DISPOSABLE) ×3 IMPLANT
SYR BULB 3OZ (MISCELLANEOUS) IMPLANT
TOWEL GREEN STERILE FF (TOWEL DISPOSABLE) ×3 IMPLANT
TUBE CONNECTING 20'X1/4 (TUBING) ×1
TUBE CONNECTING 20X1/4 (TUBING) ×2 IMPLANT
TUBE SALEM SUMP 12R W/ARV (TUBING) IMPLANT
TUBE SALEM SUMP 16 FR W/ARV (TUBING) IMPLANT

## 2018-05-23 NOTE — Anesthesia Postprocedure Evaluation (Signed)
Anesthesia Post Note  Patient: Zachary Dickerson.  Procedure(s) Performed: NASOPHARYNGEAL BIOPSY AND FROZEN SECTION (N/A Mouth)     Patient location during evaluation: PACU Anesthesia Type: General Level of consciousness: awake and alert Pain management: pain level controlled Vital Signs Assessment: post-procedure vital signs reviewed and stable Respiratory status: spontaneous breathing, nonlabored ventilation, respiratory function stable and patient connected to nasal cannula oxygen Cardiovascular status: blood pressure returned to baseline and stable Postop Assessment: no apparent nausea or vomiting Anesthetic complications: no    Last Vitals:  Vitals:   05/23/18 1252 05/23/18 1319  BP:  127/81  Pulse: 84 92  Resp: 13 16  Temp:  36.9 C  SpO2: 99% 96%    Last Pain:  Vitals:   05/23/18 1319  TempSrc: Oral  PainSc: 2                  Barnet Glasgow

## 2018-05-23 NOTE — Anesthesia Procedure Notes (Signed)
Procedure Name: Intubation Date/Time: 05/23/2018 11:46 AM Performed by: Maryella Shivers, CRNA Pre-anesthesia Checklist: Patient identified, Emergency Drugs available, Suction available and Patient being monitored Patient Re-evaluated:Patient Re-evaluated prior to induction Oxygen Delivery Method: Circle system utilized Preoxygenation: Pre-oxygenation with 100% oxygen Induction Type: IV induction Ventilation: Mask ventilation without difficulty Laryngoscope Size: Mac and 3 Tube type: Oral Tube size: 8.0 mm Number of attempts: 1 Airway Equipment and Method: Stylet and Oral airway Placement Confirmation: ETT inserted through vocal cords under direct vision,  positive ETCO2 and breath sounds checked- equal and bilateral Secured at: 22 cm Tube secured with: Tape Dental Injury: Teeth and Oropharynx as per pre-operative assessment

## 2018-05-23 NOTE — Transfer of Care (Signed)
Immediate Anesthesia Transfer of Care Note  Patient: Zachary Dickerson.  Procedure(s) Performed: NASOPHARYNGEAL BIOPSY AND FROZEN SECTION (N/A Mouth)  Patient Location: PACU  Anesthesia Type:General  Level of Consciousness: sedated  Airway & Oxygen Therapy: Patient Spontanous Breathing and Patient connected to face mask oxygen  Post-op Assessment: Report given to RN and Post -op Vital signs reviewed and stable  Post vital signs: Reviewed and stable  Last Vitals:  Vitals Value Taken Time  BP 103/83 05/23/2018 12:33 PM  Temp 37 C 05/23/2018 12:35 PM  Pulse 82 05/23/2018 12:37 PM  Resp 16 05/23/2018 12:37 PM  SpO2 100 % 05/23/2018 12:37 PM  Vitals shown include unvalidated device data.  Last Pain:  Vitals:   05/23/18 1032  TempSrc: Oral  PainSc: 6          Complications: No apparent anesthesia complications

## 2018-05-23 NOTE — Discharge Instructions (Addendum)
The patient may resume all his previous activities and diet. He will follow-up in my [place] office in one week.      Post Anesthesia Home Care Instructions  Activity: Get plenty of rest for the remainder of the day. A responsible individual must stay with you for 24 hours following the procedure.  For the next 24 hours, DO NOT: -Drive a car -Paediatric nurse -Drink alcoholic beverages -Take any medication unless instructed by your physician -Make any legal decisions or sign important papers.  Meals: Start with liquid foods such as gelatin or soup. Progress to regular foods as tolerated. Avoid greasy, spicy, heavy foods. If nausea and/or vomiting occur, drink only clear liquids until the nausea and/or vomiting subsides. Call your physician if vomiting continues.  Special Instructions/Symptoms: Your throat may feel dry or sore from the anesthesia or the breathing tube placed in your throat during surgery. If this causes discomfort, gargle with warm salt water. The discomfort should disappear within 24 hours.  If you had a scopolamine patch placed behind your ear for the management of post- operative nausea and/or vomiting:  1. The medication in the patch is effective for 72 hours, after which it should be removed.  Wrap patch in a tissue and discard in the trash. Wash hands thoroughly with soap and water. 2. You may remove the patch earlier than 72 hours if you experience unpleasant side effects which may include dry mouth, dizziness or visual disturbances. 3. Avoid touching the patch. Wash your hands with soap and water after contact with the patch.

## 2018-05-23 NOTE — H&P (Signed)
Cc: Neck mass  HPI: The patient is a 57 y/o male who presents today for evaluation of bilateral neck masses. The patient is seen in consultation requested by [facility]. The patient noted right sided neck swelling 4 weeks ago which he originally thought was from a bee sting. The swelling persisted so he went to the ER for further evaluation. CT done at [facility] showed extensive enlarged bilateral necrotic adenopathy compatible with metastatic disease. Enlarged adenoid tissue with ulceration was noted and concerning for primary nasopharyngeal carcinoma. The patient has noted unexplained weight loss in the past few months. He has experienced some throat and neck discomfort. He is a 40 + pack year smoker. No previous ENT surgery is noted.   The patient's review of systems (constitutional, eyes, ENT, cardiovascular, respiratory, GI, musculoskeletal, skin, neurologic, psychiatric, endocrine, hematologic, allergic) is noted in the ROS questionnaire.  It is reviewed with the patient.   Family health history: None.   Major events: Cervical spine  and hip fracture / surgery,.   Ongoing medical problems: Arthritis, anxiety, chest pain, COPD, panic attack, neck mass.   Social history: The patient is single. He smokes half pack of cigarettes a day. He denies the use of alcohol or illegal drugs.   Exam: General: Communicates without difficulty, well nourished, no acute distress. Head: Normocephalic, no evidence injury, no tenderness, facial buttresses intact without stepoff. Eyes: PERRL, EOMI. No scleral icterus, conjunctivae clear. Neuro: CN II exam reveals vision grossly intact.  No nystagmus at any point of gaze. Ears: Auricles well formed without lesions.  Ear canals are intact without mass or lesion.  No erythema or edema is appreciated.  The TMs are intact without fluid. Nose: External evaluation reveals normal support and skin without lesions.  Dorsum is intact.  Anterior rhinoscopy reveals  congested mucosa over anterior aspect of inferior turbinates and deviated septum.  No purulence noted. Oral:  Oral cavity and oropharynx are intact, symmetric, without erythema or edema.  Mucosa is moist without lesions. Neck: Limited range of motion without pain.  Significant lymphadenopathy. Thyroid bed within normal limits to palpation.  Parotid glands and submandibular glands equal bilaterally without mass.  Trachea is midline. Neuro:  CN 2-12 grossly intact. Gait normal. Vestibular: No nystagmus at any point of gaze.   Procedure:  Flexible Nasal Endoscopy: Risks, benefits, and alternatives of flexible endoscopy were explained to the patient.  Specific mention was made of the risk of throat numbness with difficulty swallowing, possible bleeding from the nose and mouth, and pain from the procedure.  The patient gave oral consent to proceed.  The nasal cavities were decongested and anesthetised with a combination of oxymetazoline and 4% lidocaine solution.  The flexible scope was inserted into the right nasal cavity. NSD noted. Endoscopy of the inferior and middle meatus was performed.  No polyp, mass, or lesion was appreciated.  Olfactory cleft was clear.  Ulcerative mass filling the nasopharyngeal space.  Turbinates were without mass.  The procedure was repeated on the contralateral side with similar findings.  The patient tolerated the procedure well.  Instructions were given to avoid eating or drinking for 2 hours.   Assessment  1. The patient is noted to have an ulcerative mass filling the nasopharyngeal space with multiple enlarged bilateral lymph nodes.  2. 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   Plan 1. The physical exam, nasal endoscopy, and CT findings are reviewed with the patient and his family.  2.  Differential diagnosis is also discussed at length.  3. Recommend nasopharyngeal biopsy with frozen section. The risks, benefits, alternatives,  and details of the procedure are reviewed with the patient. Questions are invited and answered.  4. The procedure will be scheduled as soon as possible so that a treatment plan can be implemented.

## 2018-05-23 NOTE — Op Note (Signed)
DATE OF PROCEDURE:  05/23/2018                              OPERATIVE REPORT  SURGEON:  Leta Baptist, MD  PREOPERATIVE DIAGNOSES: 1. Nasopharyngeal mass  POSTOPERATIVE DIAGNOSES: 1. Nasopharyngeal mass  PROCEDURE PERFORMED:  Biopsy of nasopharyngeal mass  ANESTHESIA:  General endotracheal tube anesthesia.  COMPLICATIONS:  None.  ESTIMATED BLOOD LOSS:  Minimal.  INDICATION FOR PROCEDURE:  Zachary Dickerson. is a 57 y.o. male who initially presented to the [facility] emergency room complaining of right neck mass and neck pain. His CT scan showed bilateral necrotic lymph nodes, with an ulcerative mass noted within his nasopharynx. The nasopharyngeal mass was confirmed on flexible endoscopy examination. The findings were concerning for malignancy. Based on the above findings, the decision was made for the patient to undergo the biopsy procedure.  The risks, benefits, alternatives, and details of the procedure were discussed with the patient.  Questions were invited and answered.  Informed consent was obtained.  DESCRIPTION:  The patient was taken to the operating room and placed supine on the operating table.  General endotracheal tube anesthesia was administered by the anesthesiologist.  The patient was positioned and prepped and draped in a standard fashion for Nasopharyngeal biopsy.  A Crowe-Davis mouth gag was inserted into the oral cavity for exposure. A red rubber catheter was inserted via the nasal cavity into the pharynx. The red rubber catheter was used to retract the soft palate. Indirect mirror examination of the nasal nasopharynx revealed an ulcerated fungating mass. 3 biopsy specimens were obtained using a Sinclair forceps. The specimen was sent to the pathology department for frozen section analysis. The result was consistent with malignancy. The Crowe-Davis mouth gag was removed.   The care of the patient was turned over to the anesthesiologist.  The patient was awakened from anesthesia  without difficulty.  The patient was extubated and transferred to the recovery room in good condition.  OPERATIVE FINDINGS: Nasopharyngeal mass.  SPECIMEN:  Nasopharyngeal biopsy specimens.  FOLLOWUP CARE:  The patient will be discharged home once awake and alert.    Jalayla Chrismer Cassie Freer 05/23/2018 12:33 PM

## 2018-05-24 ENCOUNTER — Encounter (HOSPITAL_BASED_OUTPATIENT_CLINIC_OR_DEPARTMENT_OTHER): Payer: Self-pay | Admitting: Otolaryngology

## 2018-05-25 ENCOUNTER — Other Ambulatory Visit (HOSPITAL_COMMUNITY): Payer: Self-pay | Admitting: Nurse Practitioner

## 2018-05-25 DIAGNOSIS — C119 Malignant neoplasm of nasopharynx, unspecified: Secondary | ICD-10-CM

## 2018-05-29 ENCOUNTER — Inpatient Hospital Stay (HOSPITAL_COMMUNITY): Payer: Medicare HMO

## 2018-05-29 ENCOUNTER — Encounter (HOSPITAL_COMMUNITY): Payer: Self-pay | Admitting: Hematology

## 2018-05-29 ENCOUNTER — Inpatient Hospital Stay (HOSPITAL_COMMUNITY): Payer: Medicare HMO | Attending: Hematology | Admitting: Hematology

## 2018-05-29 ENCOUNTER — Other Ambulatory Visit: Payer: Self-pay

## 2018-05-29 VITALS — BP 122/73 | HR 93 | Temp 97.6°F | Resp 20 | Ht 69.0 in | Wt 205.2 lb

## 2018-05-29 DIAGNOSIS — C119 Malignant neoplasm of nasopharynx, unspecified: Secondary | ICD-10-CM

## 2018-05-29 DIAGNOSIS — G8929 Other chronic pain: Secondary | ICD-10-CM | POA: Diagnosis not present

## 2018-05-29 DIAGNOSIS — M542 Cervicalgia: Secondary | ICD-10-CM | POA: Insufficient documentation

## 2018-05-29 DIAGNOSIS — J449 Chronic obstructive pulmonary disease, unspecified: Secondary | ICD-10-CM | POA: Diagnosis not present

## 2018-05-29 DIAGNOSIS — F17210 Nicotine dependence, cigarettes, uncomplicated: Secondary | ICD-10-CM | POA: Diagnosis not present

## 2018-05-29 LAB — CBC WITH DIFFERENTIAL/PLATELET
Basophils Absolute: 0.1 10*3/uL (ref 0.0–0.1)
Basophils Relative: 0 %
EOS PCT: 7 %
Eosinophils Absolute: 0.9 10*3/uL — ABNORMAL HIGH (ref 0.0–0.7)
HCT: 40.4 % (ref 39.0–52.0)
Hemoglobin: 13.3 g/dL (ref 13.0–17.0)
LYMPHS ABS: 4.2 10*3/uL — AB (ref 0.7–4.0)
LYMPHS PCT: 33 %
MCH: 31.2 pg (ref 26.0–34.0)
MCHC: 32.9 g/dL (ref 30.0–36.0)
MCV: 94.8 fL (ref 78.0–100.0)
MONO ABS: 1.6 10*3/uL — AB (ref 0.1–1.0)
MONOS PCT: 13 %
Neutro Abs: 6.1 10*3/uL (ref 1.7–7.7)
Neutrophils Relative %: 47 %
PLATELETS: 487 10*3/uL — AB (ref 150–400)
RBC: 4.26 MIL/uL (ref 4.22–5.81)
RDW: 14.8 % (ref 11.5–15.5)
WBC: 12.8 10*3/uL — ABNORMAL HIGH (ref 4.0–10.5)
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
## 2018-05-29 NOTE — Patient Instructions (Signed)
[place] at [facility] Discharge Instructions  Follow up with [place] after scans are done.   Thank you for choosing [place] at [facility] to provide your oncology and hematology care.  To afford each patient quality time with our provider, please arrive at least 15 minutes before your scheduled appointment time.   If you have a lab appointment with the [facility] please come in thru the  Main Entrance and check in at the main information desk  You need to re-schedule your appointment should you arrive 10 or more minutes late.  We strive to give you quality time with our providers, and arriving late affects you and other patients whose appointments are after yours.  Also, if you no show three or more times for appointments you may be dismissed from the clinic at the providers discretion.     Again, thank you for choosing [facility].  Our hope is that these requests will decrease the amount of time that you wait before being seen by our physicians.       _____________________________________________________________  Should you have questions after your visit to [facility], please contact our office at [phone] between the hours of 8:00 a.m. and 4:30 p.m.  Voicemails left after 4:00 p.m. will not be returned until the following business day.  For prescription refill requests, have your pharmacy contact our office and allow 72 hours.    Cancer Center Support Programs:   > Cancer Support Group  2nd Tuesday of the month 1pm-2pm, Journey Room

## 2018-05-30 ENCOUNTER — Ambulatory Visit
Admission: RE | Admit: 2018-05-30 | Discharge: 2018-05-30 | Disposition: A | Discharge status: Home / Self Care | Payer: Medicare HMO | Source: Ambulatory Visit | Attending: Nurse Practitioner | Admitting: Nurse Practitioner

## 2018-05-30 DIAGNOSIS — C119 Malignant neoplasm of nasopharynx, unspecified: Secondary | ICD-10-CM | POA: Insufficient documentation

## 2018-05-30 DIAGNOSIS — C770 Secondary and unspecified malignant neoplasm of lymph nodes of head, face and neck: Secondary | ICD-10-CM | POA: Insufficient documentation

## 2018-05-30 DIAGNOSIS — R590 Localized enlarged lymph nodes: Secondary | ICD-10-CM | POA: Insufficient documentation

## 2018-05-30 DIAGNOSIS — R918 Other nonspecific abnormal finding of lung field: Secondary | ICD-10-CM | POA: Diagnosis not present

## 2018-05-30 LAB — GLUCOSE, CAPILLARY: GLUCOSE-CAPILLARY: 98 mg/dL (ref 70–99)

## 2018-05-30 MED ORDER — FLUDEOXYGLUCOSE F - 18 (FDG) INJECTION
10.6000 | Freq: Once | INTRAVENOUS | Status: AC | PRN
  Administered 2018-05-30: 10.7 via INTRAVENOUS

## 2018-05-30 NOTE — Addendum Note (Signed)
Addended by: Gerhard Perches on: 05/30/2018 10:44 AM   Modules accepted: Orders

## 2018-05-31 ENCOUNTER — Inpatient Hospital Stay (HOSPITAL_COMMUNITY): Payer: Medicare HMO

## 2018-05-31 ENCOUNTER — Ambulatory Visit (INDEPENDENT_AMBULATORY_CARE_PROVIDER_SITE_OTHER): Payer: Medicare HMO | Admitting: Otolaryngology

## 2018-05-31 ENCOUNTER — Encounter (HOSPITAL_COMMUNITY): Payer: Self-pay | Admitting: General Practice

## 2018-05-31 DIAGNOSIS — C119 Malignant neoplasm of nasopharynx, unspecified: Secondary | ICD-10-CM | POA: Diagnosis not present

## 2018-05-31 NOTE — Progress Notes (Signed)
[place] Psychosocial Distress Screening Clinical Social Work  Clinical Social Work was referred by distress screening protocol.  The patient scored a 8 on the Psychosocial Distress Thermometer which indicates moderate distress. Clinical Social Worker contacted patient by phone to assess for distress and other psychosocial needs. Unable to reach patient, spoke w fiancee who answered phone.  "No one will tell us anything, we are left in the dark."  Caregiver was shocked by diagnosis, concerned that patient was asked about difficulty breathing "I stayed awake all night watching him breathe."  Notes that patient's demeanor has changed to more anger, irritability - "I feel like he is still in a state of shock, its always on his mind."  Notes patient is struggling w lack of information about severity of diagnosis, prognosis, treatment plan.  Discussed process of treatment team gathering information in order to properly plan treatment, noted this time is often marked by significant anxiety for patients and families.  Encouraged fiancee to connect w caregiver support group at [facility] provided contact information.    ONCBCN DISTRESS SCREENING 05/29/2018  Screening Type Initial Screening  Distress experienced in past week (1-10) 8  Practical problem type Food  Emotional problem type Nervousness/Anxiety;Adjusting to illness  Information Concerns Type Lack of info about treatment;Lack of info about diagnosis  Physical Problem type Sleep/insomnia;Breathing    Clinical Social Worker follow up needed: Yes.    If yes, follow up plan:  Call back tomorrow to speak w patient.  Beverely Pace, [place], LCSW Clinical Social Worker Phone:  [phone]

## 2018-06-03 LAB — EPSTEIN BARR VRS(EBV DNA BY PCR)
EBV DNA QN by PCR: 5779 copies/mL
log10 EBV DNA Qn PCR: 3.762 log10 copy/mL

## 2018-06-05 ENCOUNTER — Encounter (HOSPITAL_COMMUNITY): Payer: Self-pay

## 2018-06-05 ENCOUNTER — Ambulatory Visit (HOSPITAL_COMMUNITY)
Admission: RE | Admit: 2018-06-05 | Discharge: 2018-06-05 | Disposition: A | Discharge status: Home / Self Care | Payer: Medicare HMO | Source: Ambulatory Visit | Attending: Nurse Practitioner | Admitting: Nurse Practitioner

## 2018-06-05 DIAGNOSIS — C119 Malignant neoplasm of nasopharynx, unspecified: Secondary | ICD-10-CM

## 2018-06-06 ENCOUNTER — Other Ambulatory Visit (HOSPITAL_COMMUNITY): Payer: Self-pay | Admitting: Nurse Practitioner

## 2018-06-06 ENCOUNTER — Telehealth (HOSPITAL_COMMUNITY): Payer: Self-pay | Admitting: *Deleted

## 2018-06-06 ENCOUNTER — Ambulatory Visit (HOSPITAL_COMMUNITY): Payer: Medicare HMO | Admitting: Hematology

## 2018-06-06 DIAGNOSIS — C119 Malignant neoplasm of nasopharynx, unspecified: Secondary | ICD-10-CM

## 2018-06-06 MED ORDER — HYDROCODONE-ACETAMINOPHEN 5-325 MG PO TABS: 1.0000 | ORAL_TABLET | Freq: Four times a day (QID) | ORAL | 0 refills | Status: DC | PRN

## 2018-06-06 NOTE — Telephone Encounter (Signed)
Pt's wife called requesting that we call [facility] and give them permission to give Noha the Hydrocodone to take with his subutex. Pt's wife stated that [facility] pain management Dr knew what was going on. I called [facility] in [place] and spoke with Derrick the [place], I asked him what he recommended we do for the pt, and explained that the pt's wife called and asked that we ok the pharmacy to fill the Hydrocodone and if so send the Rx to [facility] in [place]. Montine Circle stated that if we thought it is necessary for the pt to have this medication that he would have to stop the Subutex that he is currently taking. Pt has been a pt there at the pain clinic for 2 years and has done well on his therapy.   I spoke with Dr.Katragadda and Randi and they both decided that it would be better for the pt to continue on his Subutex and continue to follow up with the pain clinic.    I called and spoke to the pt's wife and advised her that Dr. Raliegh Ip wants the pt to continue on his subutex for right now and continue to follow up with the pain clinic. The pt's wife verbalized understanding.

## 2018-06-06 NOTE — Progress Notes (Signed)
mmri neck

## 2018-06-21 ENCOUNTER — Other Ambulatory Visit: Payer: Self-pay

## 2018-06-21 ENCOUNTER — Encounter (HOSPITAL_COMMUNITY): Payer: Self-pay

## 2018-06-21 ENCOUNTER — Emergency Department (HOSPITAL_COMMUNITY)
Admission: EM | Admit: 2018-06-21 | Discharge: 2018-06-21 | Disposition: A | Discharge status: Home / Self Care | Payer: Medicare HMO | Attending: Emergency Medicine | Admitting: Emergency Medicine

## 2018-06-21 ENCOUNTER — Emergency Department (HOSPITAL_COMMUNITY): Payer: Medicare HMO

## 2018-06-21 DIAGNOSIS — C119 Malignant neoplasm of nasopharynx, unspecified: Secondary | ICD-10-CM | POA: Insufficient documentation

## 2018-06-21 DIAGNOSIS — Z79899 Other long term (current) drug therapy: Secondary | ICD-10-CM | POA: Diagnosis not present

## 2018-06-21 DIAGNOSIS — R51 Headache: Secondary | ICD-10-CM | POA: Diagnosis present

## 2018-06-21 DIAGNOSIS — F17210 Nicotine dependence, cigarettes, uncomplicated: Secondary | ICD-10-CM | POA: Insufficient documentation

## 2018-06-21 DIAGNOSIS — J449 Chronic obstructive pulmonary disease, unspecified: Secondary | ICD-10-CM | POA: Diagnosis not present

## 2018-06-21 DIAGNOSIS — C770 Secondary and unspecified malignant neoplasm of lymph nodes of head, face and neck: Secondary | ICD-10-CM

## 2018-06-21 LAB — BASIC METABOLIC PANEL
Anion gap: 10 (ref 5–15)
BUN: 16 mg/dL (ref 6–20)
CALCIUM: 9.5 mg/dL (ref 8.9–10.3)
CO2: 23 mmol/L (ref 22–32)
CREATININE: 1.22 mg/dL (ref 0.61–1.24)
Chloride: 104 mmol/L (ref 98–111)
GFR calc non Af Amer: >60 mL/min (ref >60)
Glucose, Bld: 90 mg/dL (ref 70–99)
Potassium: 4.7 mmol/L (ref 3.5–5.1)
SODIUM: 137 mmol/L (ref 135–145)

## 2018-06-21 LAB — CBC
HCT: 44.6 % (ref 39.0–52.0)
Hemoglobin: 14.3 g/dL (ref 13.0–17.0)
MCH: 31 pg (ref 26.0–34.0)
MCHC: 32.1 g/dL (ref 30.0–36.0)
MCV: 96.7 fL (ref 78.0–100.0)
PLATELETS: 490 10*3/uL — AB (ref 150–400)
RBC: 4.61 MIL/uL (ref 4.22–5.81)
RDW: 14.6 % (ref 11.5–15.5)
WBC: 14.5 10*3/uL — AB (ref 4.0–10.5)

## 2018-06-21 LAB — I-STAT TROPONIN, ED: TROPONIN I, POC: 0.03 ng/mL (ref 0.00–0.08)

## 2018-06-21 MED ORDER — GABAPENTIN 300 MG PO CAPS: 600.0000 mg | ORAL_CAPSULE | Freq: Three times a day (TID) | ORAL | 0 refills | Status: DC

## 2018-06-21 MED ORDER — LORAZEPAM 2 MG/ML IJ SOLN
1.0000 mg | Freq: Once | INTRAMUSCULAR | Status: AC
  Administered 2018-06-21: 1 mg via INTRAVENOUS
  Filled 2018-06-21: qty 1

## 2018-06-21 MED ORDER — DEXAMETHASONE SODIUM PHOSPHATE 10 MG/ML IJ SOLN
10.0000 mg | Freq: Once | INTRAMUSCULAR | Status: AC
  Administered 2018-06-21: 10 mg via INTRAVENOUS
  Filled 2018-06-21: qty 1

## 2018-06-21 MED ORDER — LORAZEPAM 1 MG PO TABS: 1.0000 mg | ORAL_TABLET | Freq: Three times a day (TID) | ORAL | 0 refills | Status: DC | PRN

## 2018-06-21 MED ORDER — IOHEXOL 300 MG/ML  SOLN
75.0000 mL | Freq: Once | INTRAMUSCULAR | Status: AC | PRN
  Administered 2018-06-21: 75 mL via INTRAVENOUS

## 2018-06-21 MED ORDER — HYDROMORPHONE HCL 1 MG/ML IJ SOLN
1.0000 mg | Freq: Once | INTRAMUSCULAR | Status: AC
  Administered 2018-06-21: 1 mg via INTRAVENOUS
  Filled 2018-06-21: qty 1

## 2018-06-21 NOTE — ED Notes (Addendum)
Pt states he has follow up on the 26th to get an MRI

## 2018-06-21 NOTE — ED Triage Notes (Addendum)
Pt endorses neck swelling with pain x 1 month. Pt state that pain is radiating down into the right side of the chest x 3 days. Pt recently diagnosed with cancer in the same area. Has not started treatment. VSS.

## 2018-06-21 NOTE — ED Notes (Signed)
Patient transported to CT 

## 2018-06-21 NOTE — Discharge Instructions (Addendum)
Call Dr. Raliegh Ip in the morning to discuss your ER visit, imaging, and treatment plan.   I've prescribed you something to help with anxiety - this is to REPLACE your Xanax (do not ever take both), and do not drink while taking this.  I have also prescribed a HIGHER dose of gabapentin to help with the pain. Right now, you're taking one tablet (300 mg tablet). You can start taking TWO tablets up to three times a day. I've prescribed additional tablets if needed.

## 2018-06-21 NOTE — ED Notes (Signed)
Pt agitated coming to the desk and stating he is "tired of waiting and gonna leave," pt advised that we are doing the best we can and he will go to CT soon. Pt agreed to sit down and wait

## 2018-06-21 NOTE — ED Notes (Signed)
Patient verbalizes understanding of discharge instructions. Opportunity for questioning and answers were provided. Armband removed by staff, pt discharged from ED.  

## 2018-06-21 NOTE — ED Provider Notes (Addendum)
[facility] EMERGENCY DEPARTMENT Provider Note   CSN: [id number] Arrival date & time: 06/21/18  7829     History   Chief Complaint Chief Complaint  Patient presents with  . Neck Pain  . Chest Pain    HPI Zachary Mele. is a 57 y.o. male.  HPI   57 year old male with history of recently diagnosed nasopharyngeal carcinoma here with ongoing neck and chest pain.  The patient states that over the last 2 to 3 months, he has had progressively worsening neck pain.  This pain occasionally radiates down his lower back into his chest.  He reports that over the last several weeks, he has had worsening of this pain.  He describes the pain as a fullness sensation in his anterior and posterior neck with radiation around his neck with a sharp, stabbing, shooting pain along his lower neck/upper back that radiates to hsi chest. No anterior CP, SOB, palpitations. Pain is worse with movement and palpation. He's had some fullness feeling with swallowing since his diagnosis weeks ago but has had no worsening of this. No SOB. No cough. No visual changes. Per family, pt has had some behavioral changes and occasional confusion though he has also not been sleepign well 2/2 the stress of his diagnosis. He has also run out of his Xanax previously prescribed and this had made thins significantly worse. Denies any specific alleviating factors. No UE or LE numbness, weakness, paresthesias.  Past Medical History:  Diagnosis Date  . Anxiety   . Arthritis    neck, back  . Cancer ([place])   . Chronic narcotic use   . Chronic neck pain   . COPD (chronic obstructive pulmonary disease) ([place])   . Panic attack   . Smoker     Patient Active Problem List   Diagnosis Date Noted  . Nasopharyngeal cancer ([place]) 05/29/2018    Past Surgical History:  Procedure Laterality Date  . FRACTURE SURGERY    . NASOPHARYNGEAL BIOPSY N/A 05/23/2018   Procedure: NASOPHARYNGEAL BIOPSY AND FROZEN SECTION;  Surgeon:  Leta Baptist, MD;  Location: [facility];  Service: ENT;  Laterality: N/A;  . NECK SURGERY    . NECK SURGERY    . SPLENECTOMY, TOTAL          Home Medications    Prior to Admission medications   Medication Sig Start Date End Date Taking? Authorizing Provider  ALPRAZolam Duanne Moron) 1 MG tablet Take 1 mg by mouth 3 (three) times daily.     [provider]  buprenorphine (SUBUTEX) 8 MG SUBL SL tablet Place 8 mg under the tongue 3 (three) times daily before meals. 04/24/18   [provider]  gabapentin (NEURONTIN) 300 MG capsule Take 2 capsules (600 mg total) by mouth 3 (three) times daily for 10 days. 06/21/18 07/01/18  Duffy Bruce, MD  HYDROcodone-acetaminophen (NORCO/VICODIN) 5-325 MG tablet Take 1 tablet by mouth every 6 (six) hours as needed for severe pain. 06/06/18   Lockamy, Randi L, NP-C  LORazepam (ATIVAN) 1 MG tablet Take 1 tablet (1 mg total) by mouth every 8 (eight) hours as needed for anxiety. 06/21/18   Duffy Bruce, MD  QUEtiapine (SEROQUEL) 25 MG tablet Take 25 mg by mouth at bedtime.  07/09/13   [provider]    Family History Family History  Problem Relation Age of Onset  . Emphysema Mother   . Heart attack Father   . Emphysema Sister   . Heart disease Sister   .  Hypertension Sister   . COPD Sister   . Heart attack Brother   . Emphysema Brother   . Heart disease Brother   . Hypertension Brother   . Heart attack Paternal Aunt   . Hypertension Paternal Aunt   . Diabetes Paternal Aunt   . Heart disease Paternal Aunt   . Heart attack Paternal Uncle   . Hypertension Paternal Uncle   . Diabetes Paternal Uncle   . Heart disease Paternal Uncle   . Leukemia Maternal Grandmother   . Emphysema Maternal Grandfather   . Stroke Sister   . Heart disease Sister   . Emphysema Sister   . COPD Sister     Social History Social History   Tobacco Use  . Smoking status: Current Every Day Smoker    Packs/day: 0.50    Years: 42.00     Pack years: 21.00    Types: Cigarettes  . Smokeless tobacco: Never Used  Substance Use Topics  . Alcohol use: No  . Drug use: No     Allergies   Patient has no known allergies.   Review of Systems Review of Systems  Constitutional: Positive for fatigue. Negative for chills and fever.  HENT: Positive for congestion. Negative for rhinorrhea.   Eyes: Negative for visual disturbance.  Respiratory: Negative for cough, shortness of breath and wheezing.   Cardiovascular: Positive for chest pain. Negative for leg swelling.  Gastrointestinal: Negative for abdominal pain, diarrhea, nausea and vomiting.  Genitourinary: Negative for dysuria and flank pain.  Musculoskeletal: Positive for arthralgias, back pain and neck pain. Negative for neck stiffness.  Skin: Negative for rash and wound.  Allergic/Immunologic: Negative for immunocompromised state.  Neurological: Positive for headaches. Negative for syncope and weakness.  Hematological: Positive for adenopathy.  All other systems reviewed and are negative.    Physical Exam Updated Vital Signs BP 134/82 (BP Location: Left Arm)   Pulse 77   Temp 97.6 F (36.4 C) (Oral)   Resp 16   Ht 5\' 9"  (1.753 m)   Wt 90.7 kg   SpO2 97%   BMI 29.53 kg/m   Physical Exam  Constitutional: He is oriented to person, place, and time. He appears well-developed and well-nourished. No distress.  HENT:  Head: Normocephalic and atraumatic.  Eyes: Pupils are equal, round, and reactive to light. Conjunctivae and EOM are normal.  Neck:  Bulky, non-tender bilateral cervical LAD with some associated mild TTP over bilateral SCMs. No midline posterior TTP. No meningismus or rigidity.  Cardiovascular: Normal rate, regular rhythm and normal heart sounds. Exam reveals no friction rub.  No murmur heard. Pulmonary/Chest: Effort normal and breath sounds normal. No respiratory distress. He has no wheezes. He has no rales.  Abdominal: He exhibits no distension.    Musculoskeletal: He exhibits no edema.  Neurological: He is alert and oriented to person, place, and time. He exhibits normal muscle tone.  Skin: Skin is warm. Capillary refill takes less than 2 seconds.  Psychiatric: He has a normal mood and affect.  Nursing note and vitals reviewed.   Neurological Exam:  Mental Status: Alert and oriented to person, place, and time. Attention and concentration normal. Speech clear. Recent memory is intact. Cranial Nerves: Visual fields grossly intact. EOMI and PERRLA. No nystagmus noted. Facial sensation intact at forehead, maxillary cheek, and chin/mandible bilaterally. No facial asymmetry or weakness. Hearing grossly normal. Uvula is midline, and palate elevates symmetrically. Normal SCM and trapezius strength. Tongue midline without fasciculations. Motor: Muscle strength 5/5 in proximal  and distal UE and LE bilaterally. No pronator drift. Muscle tone normal. Reflexes: 2+ and symmetrical in all four extremities.  Sensation: Intact to light touch in upper and lower extremities distally bilaterally.  Gait: Normal without ataxia. Coordination: Normal FTN bilaterally.     ED Treatments / Results  Labs (all labs ordered are listed, but only abnormal results are displayed) Labs Reviewed  CBC - Abnormal; Notable for the following components:      Result Value   WBC 14.5 (*)    Platelets 490 (*)    All other components within normal limits  BASIC METABOLIC PANEL  I-STAT TROPONIN, ED    EKG EKG Interpretation  Date/Time:  Thursday June 21 2018 10:24:01 EDT Ventricular Rate:  90 PR Interval:  132 QRS Duration: 78 QT Interval:  340 QTC Calculation: 415 R Axis:   -63 Text Interpretation:  Normal sinus rhythm Left axis deviation Abnormal ECG No significant change since last tracing Confirmed by Duffy Bruce [phone]) on 06/21/2018 7:52:03 PM   Radiology Dg Chest 2 View  Result Date: 06/21/2018 CLINICAL DATA:  Bilateral neck pain EXAM:  CHEST - 2 VIEW COMPARISON:  PET-CT 05/30/2018, chest radiograph 05/20/2018 FINDINGS: Cardiomediastinal silhouette is normal. Mediastinal contours appear intact. There is no evidence of pleural effusion or pneumothorax. Mild coarsening of the interstitium. Nodularity is noted in the right lower lobe. Osseous structures are without acute abnormality. Soft tissues are grossly normal. IMPRESSION: Nodularity in the right lower lobe, better characterized on the prior PET-CT. Per PET-CT report recommendation a 3 month follow-up, from today of the PET-CT, with contrast-enhanced chest CT should be considered. Electronically Signed   By: Fidela Salisbury M.D.   On: 06/21/2018 10:59   Ct Head Wo Contrast  Result Date: 06/21/2018 CLINICAL DATA:  Neck swelling and pain recent cancer diagnosis of lymph nodes EXAM: CT HEAD WITHOUT CONTRAST TECHNIQUE: Contiguous axial images were obtained from the base of the skull through the vertex without intravenous contrast. COMPARISON:  PET CT 05/30/2018 FINDINGS: Brain: Focal hypodensity within the inferior right frontal lobe. No hemorrhage. Mild atrophy. Prominent ventricle size. Vascular: No hyperdense vessels.  Carotid vascular calcification Skull: No fracture.  No suspicious bone lesion. Sinuses/Orbits: Mucosal thickening in the frontal and ethmoid sinuses with mucous retention cysts in the maxillary sinuses. Other: None IMPRESSION: 1. Focal hypodensity within the inferior right frontal lobe, may reflect age indeterminate focus of infarct versus edema, possibly from mass given findings on prior PET-CT. Further evaluation with MRI could be considered. 2. Negative for hemorrhage or mass effect. 3. Paranasal sinus disease. Electronically Signed   By: Donavan Foil M.D.   On: 06/21/2018 18:55   Ct Soft Tissue Neck W Contrast  Result Date: 06/21/2018 CLINICAL DATA:  Initial evaluation for neck swelling for 1 month. EXAM: CT MAXILLOFACIAL WITH CONTRAST CT NECK WITH CONTRAST  TECHNIQUE: Multidetector CT imaging of the neck was performed using the standard protocol following the bolus administration of intravenous contrast. CONTRAST:  18mL OMNIPAQUE IOHEXOL 300 MG/ML  SOLN COMPARISON:  Prior PET-CT from 05/30/2018. FINDINGS: Pharynx and larynx: Oral cavity within normal limits without discrete mass or collection. Patient is edentulous. Oropharynx within normal limits. Soft tissue fullness, primarily involving the right nasopharynx is seen, consistent with known nasopharyngeal carcinoma (series 7, image 6). No definite discrete or measurable mass identified. Finding relatively similar as compared to prior PET-CT. Potential invasion into the skull base would be better assessed by MRI. Parapharyngeal fat maintained. No retropharyngeal collection. Epiglottis normal. Vallecula clear. Remainder  of the hypopharynx and supraglottic larynx within normal limits. True cords symmetric and normal. Subglottic airway clear. Salivary glands: Salivary glands including the parotid and submandibular glands are within normal limits. Thyroid: Thyroid normal. Lymph nodes: Enlarged bilateral level II lymph nodes seen, concerning for nodal metastases. Largest of these nodes on the left is necrotic in appearance and measures 2.3 cm in short axis (series 7, image 31). Largest nodal conglomerate on the right somewhat ill-defined and intimately associated with the adjacent sternocleidomastoid muscle, measuring 2.5 cm in size multiple additional smaller rounded right level II nodes. Mildly prominent level 5 nodes noted bilaterally (series 7, image 56, image 54). Similar overall, findings relatively similar from recent PET-CT. No other pathologically enlarged lymph nodes identified within the neck. Vascular: Normal intravascular enhancement seen throughout the neck. Atherosclerotic change about the carotid bifurcations. Limited intracranial: Better assessed on concomitant head CT. Hypodensity at the anterior inferior  frontal lobes favored to reflect encephalomalacia, likely related to remote trauma. Possible additional posttraumatic encephalomalacia versus arachnoid cyst at the anterior right temporal lobe. Visualized orbits: Globes and orbital soft tissues within normal limits. Mastoids and visualized paranasal sinuses: Polypoid mucosal thickening within the maxillary sinuses. Visualized paranasal sinuses otherwise clear. Visualize mastoids clear. Skeleton: Reactive sclerotic changes at the undersurface of the clivus related to nasopharyngeal tumor. No definite extension into the skull base. Patient status post posterior decompression with fusion at C3 through T1. No acute osseus abnormality. No discrete lytic or blastic osseous lesions. Remotely healed right clavicular fracture noted. Upper chest: Mildly prominent 1 cm prevascular node noted (series 7, image 113). Remainder of the visualized upper mediastinum unremarkable. Centrilobular and paraseptal emphysematous changes present within the visualized lungs. 8 mm nodule at the posterior left upper lobe (series 7, image 97). Additional 6 mm subpleural right upper lobe nodule (series 7, image 104). Hazy subsegmental atelectatic changes. Somewhat spiculated density at the right upper lobe measuring 12 mm(series 8, image 92). Changes are relatively similar to previous. Other: None. IMPRESSION: 1. Extensive enlarged bilateral necrotic adenopathy involving primarily level II, consistent with nodal metastatic disease. Finding relatively similar as compared to recent PET-CT and neck CT from August of this year. 2. Enlarged adenoidal soft tissue/soft tissue fullness at the nasopharynx, again concerning for primary nasopharyngeal carcinoma. Finding is also similar. 3. Enlarged bilateral pulmonary nodules as above, largest of which measures 12 mm on the right. Findings relatively similar to previous exams. Consider one of the following in 3 months for both low-risk and high-risk  individuals: (a) repeat chest CT, (b) follow-up PET-CT, or (c) tissue sampling. This recommendation follows the consensus statement: Guidelines for Management of Incidental Pulmonary Nodules Detected on CT Images: From the Fleischner Society 2017; Radiology 2017; 284:228-243. 4. Emphysema. 5. Prior posterior fusion at C3 through T1 without complication. Electronically Signed   By: Jeannine Boga M.D.   On: 06/21/2018 19:18   Ct Maxillofacial W Contrast  Result Date: 06/21/2018 CLINICAL DATA:  Initial evaluation for neck swelling for 1 month. EXAM: CT MAXILLOFACIAL WITH CONTRAST CT NECK WITH CONTRAST TECHNIQUE: Multidetector CT imaging of the neck was performed using the standard protocol following the bolus administration of intravenous contrast. CONTRAST:  84mL OMNIPAQUE IOHEXOL 300 MG/ML  SOLN COMPARISON:  Prior PET-CT from 05/30/2018. FINDINGS: Pharynx and larynx: Oral cavity within normal limits without discrete mass or collection. Patient is edentulous. Oropharynx within normal limits. Soft tissue fullness, primarily involving the right nasopharynx is seen, consistent with known nasopharyngeal carcinoma (series 7, image 6). No definite discrete  or measurable mass identified. Finding relatively similar as compared to prior PET-CT. Potential invasion into the skull base would be better assessed by MRI. Parapharyngeal fat maintained. No retropharyngeal collection. Epiglottis normal. Vallecula clear. Remainder of the hypopharynx and supraglottic larynx within normal limits. True cords symmetric and normal. Subglottic airway clear. Salivary glands: Salivary glands including the parotid and submandibular glands are within normal limits. Thyroid: Thyroid normal. Lymph nodes: Enlarged bilateral level II lymph nodes seen, concerning for nodal metastases. Largest of these nodes on the left is necrotic in appearance and measures 2.3 cm in short axis (series 7, image 31). Largest nodal conglomerate on the right  somewhat ill-defined and intimately associated with the adjacent sternocleidomastoid muscle, measuring 2.5 cm in size multiple additional smaller rounded right level II nodes. Mildly prominent level 5 nodes noted bilaterally (series 7, image 56, image 54). Similar overall, findings relatively similar from recent PET-CT. No other pathologically enlarged lymph nodes identified within the neck. Vascular: Normal intravascular enhancement seen throughout the neck. Atherosclerotic change about the carotid bifurcations. Limited intracranial: Better assessed on concomitant head CT. Hypodensity at the anterior inferior frontal lobes favored to reflect encephalomalacia, likely related to remote trauma. Possible additional posttraumatic encephalomalacia versus arachnoid cyst at the anterior right temporal lobe. Visualized orbits: Globes and orbital soft tissues within normal limits. Mastoids and visualized paranasal sinuses: Polypoid mucosal thickening within the maxillary sinuses. Visualized paranasal sinuses otherwise clear. Visualize mastoids clear. Skeleton: Reactive sclerotic changes at the undersurface of the clivus related to nasopharyngeal tumor. No definite extension into the skull base. Patient status post posterior decompression with fusion at C3 through T1. No acute osseus abnormality. No discrete lytic or blastic osseous lesions. Remotely healed right clavicular fracture noted. Upper chest: Mildly prominent 1 cm prevascular node noted (series 7, image 113). Remainder of the visualized upper mediastinum unremarkable. Centrilobular and paraseptal emphysematous changes present within the visualized lungs. 8 mm nodule at the posterior left upper lobe (series 7, image 97). Additional 6 mm subpleural right upper lobe nodule (series 7, image 104). Hazy subsegmental atelectatic changes. Somewhat spiculated density at the right upper lobe measuring 12 mm(series 8, image 92). Changes are relatively similar to previous.  Other: None. IMPRESSION: 1. Extensive enlarged bilateral necrotic adenopathy involving primarily level II, consistent with nodal metastatic disease. Finding relatively similar as compared to recent PET-CT and neck CT from August of this year. 2. Enlarged adenoidal soft tissue/soft tissue fullness at the nasopharynx, again concerning for primary nasopharyngeal carcinoma. Finding is also similar. 3. Enlarged bilateral pulmonary nodules as above, largest of which measures 12 mm on the right. Findings relatively similar to previous exams. Consider one of the following in 3 months for both low-risk and high-risk individuals: (a) repeat chest CT, (b) follow-up PET-CT, or (c) tissue sampling. This recommendation follows the consensus statement: Guidelines for Management of Incidental Pulmonary Nodules Detected on CT Images: From the Fleischner Society 2017; Radiology 2017; 284:228-243. 4. Emphysema. 5. Prior posterior fusion at C3 through T1 without complication. Electronically Signed   By: Jeannine Boga M.D.   On: 06/21/2018 19:18    Procedures Procedures (including critical care time)  Medications Ordered in ED Medications  HYDROmorphone (DILAUDID) injection 1 mg (1 mg Intravenous Given 06/21/18 1557)  dexamethasone (DECADRON) injection 10 mg (10 mg Intravenous Given 06/21/18 1557)  LORazepam (ATIVAN) injection 1 mg (1 mg Intravenous Given 06/21/18 1725)  iohexol (OMNIPAQUE) 300 MG/ML solution 75 mL (75 mLs Intravenous Contrast Given 06/21/18 1803)     Initial Impression /  Assessment and Plan / ED Course  I have reviewed the triage vital signs and the nursing notes.  Pertinent labs & imaging results that were available during my care of the patient were reviewed by me and considered in my medical decision making (see chart for details).     57 yo M here with ongoing, worsening neck and posterior chest/thoracic pain. I suspect this is unfortunately 2/2 his known metastatic NP cancer, with bulky,  necrotic LAD. He has no fever, normal WBC, and no clinical signs of lymphadenitis or super-infection. CT imaging today obtained shows no obvious worsening of his LAD or cancer and no significant evidence of intracranial extension. EKG is non-ischemic. Labs are o/w reassuring. I suspect a significant component of his sx is mulitfactorial 2/2 local mass effect from his LAD but also chronic pain compounded by anxiety related to his recent diagnosis. Pt given meds here with excellent effect of a single dose of ativan. Narcotic database reviewed and I see no recent Rx for benzodiazepines, and I suspect this may be also worsened by running out of his anxiety meds. Given his o/w well appearance, do not feel acute emergent pathology likely. No signs of airway compromise. Discussed case w/ Dr. Benay Spice as pt has had good response to decadron in past and this could help inflammation 2/2 his necrotic LN. Will defer decision for outpt steroids to his oncologist. Otherwise, will give him a brief course of benzos given possible benzo w/d and severe anxiety, and d/c with outpt f/u with his Oncologist. Also feel its reasonable to increase hsi gabapentin to help with his chronic pain, also possible radicular pain 2/2 local mass effect from his LN.  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

## 2018-06-26 ENCOUNTER — Other Ambulatory Visit (HOSPITAL_COMMUNITY): Payer: Self-pay | Admitting: Nurse Practitioner

## 2018-07-04 ENCOUNTER — Other Ambulatory Visit: Payer: Self-pay

## 2018-07-04 ENCOUNTER — Encounter (HOSPITAL_COMMUNITY): Payer: Self-pay | Admitting: *Deleted

## 2018-07-04 ENCOUNTER — Ambulatory Visit (HOSPITAL_COMMUNITY): Payer: Medicare HMO | Admitting: Hematology

## 2018-07-04 NOTE — Progress Notes (Signed)
Pt denies SOB, chest pain, and being under the care of a cardiologist. Pt denies having a stress test, echo and cardiac cath. Pt requested that friend, Edwena Blow,  be provided with pre-op instructions. Edwena Blow made aware to have pt stop taking vitamins, herbal medications and Ginseng. Pt stated that he will hold  morning dose of Subutex. Shana verbalized understanding of all pre-op instructions.

## 2018-07-05 ENCOUNTER — Ambulatory Visit (HOSPITAL_COMMUNITY)
Admission: RE | Admit: 2018-07-05 | Discharge: 2018-07-05 | Disposition: A | Discharge status: Home / Self Care | Payer: Medicare HMO | Source: Ambulatory Visit | Attending: Nurse Practitioner | Admitting: Nurse Practitioner

## 2018-07-05 ENCOUNTER — Ambulatory Visit (HOSPITAL_COMMUNITY): Payer: Medicare HMO | Admitting: Anesthesiology

## 2018-07-05 ENCOUNTER — Encounter (HOSPITAL_COMMUNITY): Admission: RE | Disposition: A | Discharge status: Home / Self Care | Payer: Self-pay | Source: Ambulatory Visit

## 2018-07-05 ENCOUNTER — Encounter (HOSPITAL_COMMUNITY): Payer: Self-pay | Admitting: Certified Registered Nurse Anesthetist

## 2018-07-05 DIAGNOSIS — Z808 Family history of malignant neoplasm of other organs or systems: Secondary | ICD-10-CM | POA: Diagnosis not present

## 2018-07-05 DIAGNOSIS — J9811 Atelectasis: Secondary | ICD-10-CM | POA: Insufficient documentation

## 2018-07-05 DIAGNOSIS — J449 Chronic obstructive pulmonary disease, unspecified: Secondary | ICD-10-CM | POA: Insufficient documentation

## 2018-07-05 DIAGNOSIS — F17200 Nicotine dependence, unspecified, uncomplicated: Secondary | ICD-10-CM | POA: Insufficient documentation

## 2018-07-05 DIAGNOSIS — Z79899 Other long term (current) drug therapy: Secondary | ICD-10-CM | POA: Insufficient documentation

## 2018-07-05 DIAGNOSIS — C119 Malignant neoplasm of nasopharynx, unspecified: Secondary | ICD-10-CM

## 2018-07-05 DIAGNOSIS — Z79891 Long term (current) use of opiate analgesic: Secondary | ICD-10-CM | POA: Diagnosis not present

## 2018-07-05 DIAGNOSIS — C7951 Secondary malignant neoplasm of bone: Secondary | ICD-10-CM | POA: Diagnosis not present

## 2018-07-05 DIAGNOSIS — F419 Anxiety disorder, unspecified: Secondary | ICD-10-CM | POA: Diagnosis not present

## 2018-07-05 DIAGNOSIS — C770 Secondary and unspecified malignant neoplasm of lymph nodes of head, face and neck: Secondary | ICD-10-CM | POA: Insufficient documentation

## 2018-07-05 HISTORY — PX: RADIOLOGY WITH ANESTHESIA: SHX6223

## 2018-07-05 HISTORY — DX: Headache: R51

## 2018-07-05 HISTORY — DX: Headache, unspecified: R51.9

## 2018-07-05 SURGERY — MRI WITH ANESTHESIA
Anesthesia: General

## 2018-07-05 MED ORDER — GADOBUTROL 1 MMOL/ML IV SOLN
9.0000 mL | Freq: Once | INTRAVENOUS | Status: AC | PRN
  Administered 2018-07-05: 9 mL via INTRAVENOUS

## 2018-07-05 MED ORDER — LACTATED RINGERS IV SOLN
INTRAVENOUS | Status: DC
  Administered 2018-07-05 (×3): via INTRAVENOUS

## 2018-07-05 MED ORDER — ONDANSETRON HCL 4 MG/2ML IJ SOLN
INTRAMUSCULAR | Status: DC | PRN
  Administered 2018-07-05: 4 mg via INTRAVENOUS

## 2018-07-05 MED ORDER — FENTANYL CITRATE (PF) 100 MCG/2ML IJ SOLN
INTRAMUSCULAR | Status: DC | PRN
  Administered 2018-07-05: 50 ug via INTRAVENOUS

## 2018-07-05 MED ORDER — PROPOFOL 10 MG/ML IV BOLUS
INTRAVENOUS | Status: DC | PRN
  Administered 2018-07-05: 100 mg via INTRAVENOUS
  Administered 2018-07-05: 200 mg via INTRAVENOUS

## 2018-07-05 MED ORDER — DEXAMETHASONE SODIUM PHOSPHATE 10 MG/ML IJ SOLN
INTRAMUSCULAR | Status: DC | PRN
  Administered 2018-07-05: 4 mg via INTRAVENOUS

## 2018-07-05 MED ORDER — MIDAZOLAM HCL 2 MG/2ML IJ SOLN
INTRAMUSCULAR | Status: DC | PRN
  Administered 2018-07-05: 2 mg via INTRAVENOUS

## 2018-07-05 MED ORDER — LIDOCAINE HCL (CARDIAC) PF 100 MG/5ML IV SOSY
PREFILLED_SYRINGE | INTRAVENOUS | Status: DC | PRN
  Administered 2018-07-05: 100 mg via INTRAVENOUS

## 2018-07-05 MED ORDER — PHENYLEPHRINE HCL 10 MG/ML IJ SOLN
INTRAMUSCULAR | Status: DC | PRN
  Administered 2018-07-05 (×2): 80 ug via INTRAVENOUS
  Administered 2018-07-05 (×2): 120 ug via INTRAVENOUS
  Administered 2018-07-05 (×4): 80 ug via INTRAVENOUS

## 2018-07-05 NOTE — Anesthesia Procedure Notes (Signed)
Procedure Name: Intubation Date/Time: 07/05/2018 10:25 AM Performed by: Inda Coke, CRNA Pre-anesthesia Checklist: Patient identified, Emergency Drugs available, Suction available and Patient being monitored Patient Re-evaluated:Patient Re-evaluated prior to induction Oxygen Delivery Method: Circle System Utilized Preoxygenation: Pre-oxygenation with 100% oxygen Induction Type: IV induction Ventilation: Mask ventilation without difficulty Laryngoscope Size: Mac and 4 Grade View: Grade I Tube type: Oral Tube size: 7.5 mm Number of attempts: 1 Airway Equipment and Method: Stylet and Oral airway Placement Confirmation: ETT inserted through vocal cords under direct vision,  positive ETCO2 and breath sounds checked- equal and bilateral Secured at: 23 cm Tube secured with: Tape Dental Injury: Teeth and Oropharynx as per pre-operative assessment

## 2018-07-05 NOTE — Anesthesia Preprocedure Evaluation (Addendum)
Anesthesia Evaluation  Patient identified by MRN, date of birth, ID band Patient awake    Reviewed: Allergy & Precautions, NPO status , Patient's Chart, lab work & pertinent test results  Airway Mallampati: III  TM Distance: >3 FB Neck ROM: Full  Mouth opening: Limited Mouth Opening  Dental no notable dental hx. (+) Edentulous Upper, Edentulous Lower   Pulmonary COPD, Current Smoker,    Pulmonary exam normal breath sounds clear to auscultation       Cardiovascular negative cardio ROS Normal cardiovascular exam Rhythm:Regular Rate:Normal     Neuro/Psych  Headaches, Anxiety    GI/Hepatic negative GI ROS, Neg liver ROS,   Endo/Other  negative endocrine ROS  Renal/GU negative Renal ROS  negative genitourinary   Musculoskeletal  (+) Arthritis ,   Abdominal   Peds  Hematology negative hematology ROS (+)   Anesthesia Other Findings on subutex  recently diagnosed nasopharyngeal carcinoma seen in the ED for increased neck pain presenting for MRI  Reproductive/Obstetrics                            Anesthesia Physical Anesthesia Plan  ASA: III  Anesthesia Plan: General   Post-op Pain Management:    Induction: Intravenous  PONV Risk Score and Plan: 1 and Treatment may vary due to age or medical condition  Airway Management Planned: Oral ETT and LMA  Additional Equipment:   Intra-op Plan:   Post-operative Plan: Extubation in OR  Informed Consent: I have reviewed the patients History and Physical, chart, labs and discussed the procedure including the risks, benefits and alternatives for the proposed anesthesia with the patient or authorized representative who has indicated his/her understanding and acceptance.   Dental advisory given  Plan Discussed with: CRNA  Anesthesia Plan Comments:         Anesthesia Quick Evaluation

## 2018-07-05 NOTE — Transfer of Care (Signed)
Immediate Anesthesia Transfer of Care Note  Patient: Zachary Dickerson.  Procedure(s) Performed: MRI OF NECK WITH AND WITHOUT CONTRAST WITH ANESTHESIA (N/A )  Patient Location: PACU  Anesthesia Type:General  Level of Consciousness: awake, alert  and oriented  Airway & Oxygen Therapy: Patient Spontanous Breathing and Patient connected to nasal cannula oxygen  Post-op Assessment: Report given to RN, Post -op Vital signs reviewed and stable and Patient moving all extremities X 4  Post vital signs: Reviewed and stable  Last Vitals:  Vitals Value Taken Time  BP    Temp    Pulse 82 07/05/2018 12:27 PM  Resp 16 07/05/2018 12:27 PM  SpO2 98 % 07/05/2018 12:27 PM  Vitals shown include unvalidated device data.  Last Pain:  Vitals:   07/05/18 0848  TempSrc:   PainSc: 8       Patients Stated Pain Goal: 0 (63/33/54 [id number])  Complications: No apparent anesthesia complications

## 2018-07-06 ENCOUNTER — Encounter (HOSPITAL_COMMUNITY): Payer: Self-pay | Admitting: Radiology

## 2018-07-09 ENCOUNTER — Encounter (HOSPITAL_COMMUNITY): Payer: Self-pay | Admitting: Lab

## 2018-07-09 ENCOUNTER — Encounter (HOSPITAL_COMMUNITY): Payer: Self-pay | Admitting: Hematology

## 2018-07-09 ENCOUNTER — Other Ambulatory Visit: Payer: Self-pay

## 2018-07-09 ENCOUNTER — Inpatient Hospital Stay (HOSPITAL_COMMUNITY): Payer: Medicare HMO | Attending: Hematology | Admitting: Hematology

## 2018-07-09 VITALS — BP 134/84 | HR 125 | Temp 97.4°F | Resp 18 | Wt 206.0 lb

## 2018-07-09 DIAGNOSIS — F17210 Nicotine dependence, cigarettes, uncomplicated: Secondary | ICD-10-CM | POA: Diagnosis not present

## 2018-07-09 DIAGNOSIS — F419 Anxiety disorder, unspecified: Secondary | ICD-10-CM | POA: Insufficient documentation

## 2018-07-09 DIAGNOSIS — C119 Malignant neoplasm of nasopharynx, unspecified: Secondary | ICD-10-CM | POA: Diagnosis present

## 2018-07-09 DIAGNOSIS — C770 Secondary and unspecified malignant neoplasm of lymph nodes of head, face and neck: Secondary | ICD-10-CM | POA: Diagnosis not present

## 2018-07-09 DIAGNOSIS — Z79899 Other long term (current) drug therapy: Secondary | ICD-10-CM | POA: Insufficient documentation

## 2018-07-09 DIAGNOSIS — J449 Chronic obstructive pulmonary disease, unspecified: Secondary | ICD-10-CM | POA: Diagnosis not present

## 2018-07-09 MED ORDER — METHYLPREDNISOLONE 4 MG PO TBPK: ORAL_TABLET | ORAL | 0 refills | Status: DC
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
## 2018-07-09 NOTE — Progress Notes (Unsigned)
Referral sent to [facility]. Records faxed on 9/30

## 2018-07-09 NOTE — Patient Instructions (Signed)
[facility] at [facility] Discharge Instructions     Thank you for choosing [facility] at [facility] to provide your oncology and hematology care.  To afford each patient quality time with our provider, please arrive at least 15 minutes before your scheduled appointment time.   If you have a lab appointment with the Cancer Center please come in thru the  Main Entrance and check in at the main information desk  You need to re-schedule your appointment should you arrive 10 or more minutes late.  We strive to give you quality time with our providers, and arriving late affects you and other patients whose appointments are after yours.  Also, if you no show three or more times for appointments you may be dismissed from the clinic at the providers discretion.     Again, thank you for choosing [facility].  Our hope is that these requests will decrease the amount of time that you wait before being seen by our physicians.       _____________________________________________________________  Should you have questions after your visit to [facility], please contact our office at [phone] between the hours of 8:00 a.m. and 4:30 p.m.  Voicemails left after 4:00 p.m. will not be returned until the following business day.  For prescription refill requests, have your pharmacy contact our office and allow 72 hours.    Cancer Center Support Programs:   > Cancer Support Group  2nd Tuesday of the month 1pm-2pm, Journey Room    

## 2018-07-10 NOTE — Progress Notes (Signed)
START ON PATHWAY REGIMEN - Head and Neck   Cisplatin 40 mg/m2 IV D1 q7 Days + RT:   A cycle is every 7 days:     Cisplatin   **Always confirm dose/schedule in your pharmacy ordering system**  Cisplatin 80 mg/m2 IV D1 + 5-Fluorouracil 1,000 mg/m2/day CIV D1,2,3,4 q28 Days:   A cycle is every 28 days:     Cisplatin      5-Fluorouracil   **Always confirm dose/schedule in your pharmacy ordering system**  Patient Characteristics: Nasopharyngeal, Stage II - IVA Disease Classification: Nasopharyngeal Current Disease Status: No Distant Metastases and No Recurrent Disease AJCC T Category: T4 AJCC N Category: N2 AJCC M Category: M0 AJCC 8 Stage Grouping: IVA Intent of Therapy: Curative Intent, Discussed with Patient

## 2018-07-12 ENCOUNTER — Encounter: Payer: Self-pay | Admitting: General Surgery

## 2018-07-12 ENCOUNTER — Other Ambulatory Visit (HOSPITAL_COMMUNITY)
Admission: RE | Admit: 2018-07-12 | Discharge: 2018-07-12 | Disposition: A | Discharge status: Home / Self Care | Payer: Medicare HMO | Source: Ambulatory Visit | Attending: General Surgery | Admitting: General Surgery

## 2018-07-12 ENCOUNTER — Ambulatory Visit: Payer: Medicare HMO | Admitting: General Surgery

## 2018-07-12 VITALS — BP 152/92 | HR 100 | Temp 97.8°F | Resp 20 | Wt 216.0 lb

## 2018-07-12 DIAGNOSIS — C119 Malignant neoplasm of nasopharynx, unspecified: Secondary | ICD-10-CM | POA: Insufficient documentation

## 2018-07-12 LAB — CBC WITH DIFFERENTIAL/PLATELET
Basophils Absolute: 0 10*3/uL (ref 0.0–0.1)
Basophils Relative: 0 %
Eosinophils Absolute: 0.1 10*3/uL (ref 0.0–0.7)
Eosinophils Relative: 1 %
HCT: 42.5 % (ref 39.0–52.0)
Hemoglobin: 13.9 g/dL (ref 13.0–17.0)
LYMPHS PCT: 25 %
Lymphs Abs: 3.9 10*3/uL (ref 0.7–4.0)
MCH: 31.4 pg (ref 26.0–34.0)
MCHC: 32.7 g/dL (ref 30.0–36.0)
MCV: 96.2 fL (ref 78.0–100.0)
Monocytes Absolute: 1 10*3/uL (ref 0.1–1.0)
Monocytes Relative: 7 %
NEUTROS ABS: 10.2 10*3/uL — AB (ref 1.7–7.7)
Neutrophils Relative %: 67 %
PLATELETS: 605 10*3/uL — AB (ref 150–400)
RBC: 4.42 MIL/uL (ref 4.22–5.81)
RDW: 15.4 % (ref 11.5–15.5)
WBC: 15.2 10*3/uL — AB (ref 4.0–10.5)

## 2018-07-12 LAB — BASIC METABOLIC PANEL
Anion gap: 7 (ref 5–15)
BUN: 23 mg/dL — ABNORMAL HIGH (ref 6–20)
CALCIUM: 9.8 mg/dL (ref 8.9–10.3)
CO2: 28 mmol/L (ref 22–32)
CREATININE: 0.97 mg/dL (ref 0.61–1.24)
Chloride: 104 mmol/L (ref 98–111)
Glucose, Bld: 109 mg/dL — ABNORMAL HIGH (ref 70–99)
Potassium: 5.2 mmol/L — ABNORMAL HIGH (ref 3.5–5.1)
SODIUM: 139 mmol/L (ref 135–145)

## 2018-07-12 NOTE — H&P (Signed)
Zachary Dickerson.; [id number]; [birth year]   HPI Patient is a 57 year old white male who was referred to my care by Dr. Cindie Laroche and oncology for Port-A-Cath placement.  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  He needs central venous access.  He denies any pain. Past Medical History:  Diagnosis Date  . Anxiety   . Arthritis    neck, back  . Cancer ([place])   . Chronic narcotic use   . Chronic neck pain   . COPD (chronic obstructive pulmonary disease) ([place])   . Headache   . Panic attack   . Smoker     Past Surgical History:  Procedure Laterality Date  . FRACTURE SURGERY    . NASOPHARYNGEAL BIOPSY N/A 05/23/2018   Procedure: NASOPHARYNGEAL BIOPSY AND FROZEN SECTION;  Surgeon: Leta Baptist, MD;  Location: [facility];  Service: ENT;  Laterality: N/A;  . NECK SURGERY    . NECK SURGERY    . RADIOLOGY WITH ANESTHESIA N/A 07/05/2018   Procedure: MRI OF NECK WITH AND WITHOUT CONTRAST WITH ANESTHESIA;  Surgeon: Radiologist, Medication, MD;  Location: [facility];  Service: Radiology;  Laterality: N/A;  . SPLENECTOMY, TOTAL      Family History  Problem Relation Age of Onset  . Emphysema Mother   . Heart attack Father   . Emphysema Sister   . Heart disease Sister   . Hypertension Sister   . COPD Sister   . Heart attack Brother   . Emphysema Brother   . Heart disease Brother   . Hypertension Brother   . Heart attack Paternal Aunt   . Hypertension Paternal Aunt   . Diabetes Paternal Aunt   . Heart disease Paternal Aunt   . Heart attack Paternal Uncle   . Hypertension Paternal Uncle   . Diabetes Paternal Uncle   . Heart disease Paternal Uncle   . Leukemia Maternal Grandmother   . Emphysema Maternal Grandfather   . Stroke Sister   . Heart disease Sister   . Emphysema Sister   . COPD Sister     Current Outpatient Medications on File Prior to Visit  Medication Sig Dispense Refill  . ALPRAZolam (XANAX) 0.5 MG tablet Take 0.5 mg by mouth 3 (three)  times daily as needed for anxiety.    . buprenorphine (SUBUTEX) 8 MG SUBL SL tablet Place 8 mg under the tongue 3 (three) times daily before meals.    . gabapentin (NEURONTIN) 300 MG capsule Take 2 capsules (600 mg total) by mouth 3 (three) times daily for 10 days. (Patient taking differently: Take 300 mg by mouth 4 (four) times daily. ) 60 capsule 0  . GINSENG PO Take 1 capsule by mouth 2 (two) times daily.    . methylPREDNISolone (MEDROL DOSEPAK) 4 MG TBPK tablet Take 6 pills day one, 5 pills day 2, 4 pills day 3, 3 pills day 4, 2 pills day 5, and 1 pill on day 6 21 tablet 0  . Multiple Vitamins-Minerals (MULTIVITAMIN MEN 50+ PO) Take 1 tablet by mouth daily.    . QUEtiapine (SEROQUEL) 300 MG tablet Take 300 mg by mouth at bedtime.      No current facility-administered medications on file prior to visit.     No Known Allergies  Social History   Substance and Sexual Activity  Alcohol Use No    Social History   Tobacco Use  Smoking Status Current Every Day Smoker  . Packs/day: 0.25  . Years: 42.00  .  Pack years: 10.50  . Types: Cigarettes  Smokeless Tobacco Never Used    Review of Systems  Constitutional: Positive for chills, fever and malaise/fatigue.  Eyes: Positive for blurred vision and pain.  Respiratory: Positive for cough, shortness of breath and wheezing.   Cardiovascular: Negative.   Gastrointestinal: Negative.   Genitourinary: Negative.   Musculoskeletal: Positive for neck pain.  Skin: Negative.   Neurological: Negative.   Endo/Heme/Allergies: Negative.   Psychiatric/Behavioral: Negative.     Objective   Vitals:   07/12/18 0954  BP: (!) 152/92  Pulse: 100  Resp: 20  Temp: 97.8 F (36.6 C)    Physical Exam  Constitutional: He is oriented to person, place, and time. He appears well-developed and well-nourished. No distress.  HENT:  Head: Normocephalic and atraumatic.  Cardiovascular: Normal rate, regular rhythm and normal heart sounds. Exam reveals  no gallop and no friction rub.  No murmur heard. Pulmonary/Chest: Effort normal and breath sounds normal. No stridor. No respiratory distress. He has no wheezes. He has no rales.  Neurological: He is alert and oriented to person, place, and time.  Skin: Skin is warm and dry.  Vitals reviewed.  Oncology notes reviewed Assessment  Laryngeal carcinoma, need for central venous access Plan   Patient is scheduled for Port-A-Cath insertion on 07/16/2018.  The risks and benefits of the procedure including bleeding, infection, and pneumothorax were fully explained to the patient, who gave informed consent.

## 2018-07-12 NOTE — Patient Instructions (Signed)
Implanted Port Insertion  Implanted port insertion is a procedure to put in a port and catheter. The port is a device with an injectable disk that can be accessed by your health care provider. The port is connected to a vein in the chest or neck by a small flexible tube (catheter). There are different types of ports. The implanted port may be used as a long-term IV access for:  · Medicines, such as chemotherapy.  · Fluids.  · Liquid nutrition, such as total parenteral nutrition (TPN).  · Blood samples.    Having a port means that your health care provider will not need to use the veins in your arms for these procedures.  Tell a health care provider about:  · Any allergies you have.  · All medicines you are taking, especially blood thinners, as well as any vitamins, herbs, eye drops, creams, over-the-counter medicines, and steroids.  · Any problems you or family members have had with anesthetic medicines.  · Any blood disorders you have.  · Any surgeries you have had.  · Any medical conditions you have, including diabetes or kidney problems.  · Whether you are pregnant or may be pregnant.  What are the risks?  Generally, this is a safe procedure. However, problems may occur, including:  · Allergic reactions to medicines or dyes.  · Damage to other structures or organs.  · Infection.  · Damage to the blood vessel, bruising, or bleeding at the puncture site.  · Blood clot.  · Breakdown of the skin over the port.  · A collection of air in the chest that can cause one of the lungs to collapse (pneumothorax). This is rare.    What happens before the procedure?  Staying hydrated  Follow instructions from your health care provider about hydration, which may include:  · Up to 2 hours before the procedure - you may continue to drink clear liquids, such as water, clear fruit juice, black coffee, and plain tea.    Eating and drinking restrictions  · Follow instructions from your health care provider about eating and drinking,  which may include:  ? 8 hours before the procedure - stop eating heavy meals or foods such as meat, fried foods, or fatty foods.  ? 6 hours before the procedure - stop eating light meals or foods, such as toast or cereal.  ? 6 hours before the procedure - stop drinking milk or drinks that contain milk.  ? 2 hours before the procedure - stop drinking clear liquids.  Medicines  · Ask your health care provider about:  ? Changing or stopping your regular medicines. This is especially important if you are taking diabetes medicines or blood thinners.  ? Taking medicines such as aspirin and ibuprofen. These medicines can thin your blood. Do not take these medicines before your procedure if your health care provider instructs you not to.  · You may be given antibiotic medicine to help prevent infection.  General instructions  · Plan to have someone take you home from the hospital or clinic.  · If you will be going home right after the procedure, plan to have someone with you for 24 hours.  · You may have blood tests.  · You may be asked to shower with a germ-killing soap.  What happens during the procedure?  · To lower your risk of infection:  ? Your health care team will wash or sanitize their hands.  ? Your skin will be washed with   soap.  ? Hair may be removed from the surgical area.  · An IV tube will be inserted into one of your veins.  · You will be given one or more of the following:  ? A medicine to help you relax (sedative).  ? A medicine to numb the area (local anesthetic).  · Two small cuts (incisions) will be made to insert the port.  ? One incision will be made in your neck to get access to the vein where the catheter will lie.  ? The other incision will be made in the upper chest. This is where the port will lie.  · The procedure may be done using continuous X-ray (fluoroscopy) or other imaging tools for guidance.  · The port and catheter will be placed. There may be a small, raised area where the port  is.  · The port will be flushed with a salt solution (saline), and blood will be drawn to make sure that it is working correctly.  · The incisions will be closed.  · Bandages (dressings) may be placed over the incisions.  The procedure may vary among health care providers and hospitals.  What happens after the procedure?  · Your blood pressure, heart rate, breathing rate, and blood oxygen level will be monitored until the medicines you were given have worn off.  · Do not drive for 24 hours if you were given a sedative.  · You will be given a manufacturer's information card for the type of port that you have. Keep this with you.  · Your port will need to be flushed and checked as told by your health care provider, usually every few weeks.  · A chest X-ray will be done to:  ? Check the placement of the port.  ? Make sure there is no injury to your lung.  Summary  · Implanted port insertion is a procedure to put in a port and catheter.  · The implanted port is used as a long-term IV access.  · The port will need to be flushed and checked as told by your health care provider, usually every few weeks.  · Keep your manufacturer's information card with you at all times.  This information is not intended to replace advice given to you by your health care provider. Make sure you discuss any questions you have with your health care provider.  Document Released: 07/17/2013 Document Revised: 08/17/2016 Document Reviewed: 08/17/2016  Elsevier Interactive Patient Education © 2017 [employer]

## 2018-07-12 NOTE — Progress Notes (Signed)
Zachary Dickerson.; [id number]; 19-Apr-1961   HPI Patient is a 57 year old white male who was referred to my care by Dr. Cindie Laroche and oncology for Port-A-Cath placement.  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  He needs central venous access.  He denies any pain. Past Medical History:  Diagnosis Date  . Anxiety   . Arthritis    neck, back  . Cancer ([place])   . Chronic narcotic use   . Chronic neck pain   . COPD (chronic obstructive pulmonary disease) ([place])   . Headache   . Panic attack   . Smoker     Past Surgical History:  Procedure Laterality Date  . FRACTURE SURGERY    . NASOPHARYNGEAL BIOPSY N/A 05/23/2018   Procedure: NASOPHARYNGEAL BIOPSY AND FROZEN SECTION;  Surgeon: Leta Baptist, MD;  Location: [facility];  Service: ENT;  Laterality: N/A;  . NECK SURGERY    . NECK SURGERY    . RADIOLOGY WITH ANESTHESIA N/A 07/05/2018   Procedure: MRI OF NECK WITH AND WITHOUT CONTRAST WITH ANESTHESIA;  Surgeon: Radiologist, Medication, MD;  Location: [facility];  Service: Radiology;  Laterality: N/A;  . SPLENECTOMY, TOTAL      Family History  Problem Relation Age of Onset  . Emphysema Mother   . Heart attack Father   . Emphysema Sister   . Heart disease Sister   . Hypertension Sister   . COPD Sister   . Heart attack Brother   . Emphysema Brother   . Heart disease Brother   . Hypertension Brother   . Heart attack Paternal Aunt   . Hypertension Paternal Aunt   . Diabetes Paternal Aunt   . Heart disease Paternal Aunt   . Heart attack Paternal Uncle   . Hypertension Paternal Uncle   . Diabetes Paternal Uncle   . Heart disease Paternal Uncle   . Leukemia Maternal Grandmother   . Emphysema Maternal Grandfather   . Stroke Sister   . Heart disease Sister   . Emphysema Sister   . COPD Sister     Current Outpatient Medications on File Prior to Visit  Medication Sig Dispense Refill  . ALPRAZolam (XANAX) 0.5 MG tablet Take 0.5 mg by mouth 3 (three)  times daily as needed for anxiety.    . buprenorphine (SUBUTEX) 8 MG SUBL SL tablet Place 8 mg under the tongue 3 (three) times daily before meals.    . gabapentin (NEURONTIN) 300 MG capsule Take 2 capsules (600 mg total) by mouth 3 (three) times daily for 10 days. (Patient taking differently: Take 300 mg by mouth 4 (four) times daily. ) 60 capsule 0  . GINSENG PO Take 1 capsule by mouth 2 (two) times daily.    . methylPREDNISolone (MEDROL DOSEPAK) 4 MG TBPK tablet Take 6 pills day one, 5 pills day 2, 4 pills day 3, 3 pills day 4, 2 pills day 5, and 1 pill on day 6 21 tablet 0  . Multiple Vitamins-Minerals (MULTIVITAMIN MEN 50+ PO) Take 1 tablet by mouth daily.    . QUEtiapine (SEROQUEL) 300 MG tablet Take 300 mg by mouth at bedtime.      No current facility-administered medications on file prior to visit.     No Known Allergies  Social History   Substance and Sexual Activity  Alcohol Use No    Social History   Tobacco Use  Smoking Status Current Every Day Smoker  . Packs/day: 0.25  . Years: 42.00  .  Pack years: 10.50  . Types: Cigarettes  Smokeless Tobacco Never Used    Review of Systems  Constitutional: Positive for chills, fever and malaise/fatigue.  Eyes: Positive for blurred vision and pain.  Respiratory: Positive for cough, shortness of breath and wheezing.   Cardiovascular: Negative.   Gastrointestinal: Negative.   Genitourinary: Negative.   Musculoskeletal: Positive for neck pain.  Skin: Negative.   Neurological: Negative.   Endo/Heme/Allergies: Negative.   Psychiatric/Behavioral: Negative.     Objective   Vitals:   07/12/18 0954  BP: (!) 152/92  Pulse: 100  Resp: 20  Temp: 97.8 F (36.6 C)    Physical Exam  Constitutional: He is oriented to person, place, and time. He appears well-developed and well-nourished. No distress.  HENT:  Head: Normocephalic and atraumatic.  Cardiovascular: Normal rate, regular rhythm and normal heart sounds. Exam reveals  no gallop and no friction rub.  No murmur heard. Pulmonary/Chest: Effort normal and breath sounds normal. No stridor. No respiratory distress. He has no wheezes. He has no rales.  Neurological: He is alert and oriented to person, place, and time.  Skin: Skin is warm and dry.  Vitals reviewed.  Oncology notes reviewed Assessment  Laryngeal carcinoma, need for central venous access Plan   Patient is scheduled for Port-A-Cath insertion on 07/16/2018.  The risks and benefits of the procedure including bleeding, infection, and pneumothorax were fully explained to the patient, who gave informed consent.

## 2018-07-13 ENCOUNTER — Inpatient Hospital Stay (HOSPITAL_COMMUNITY): Payer: Medicare HMO | Attending: Hematology

## 2018-07-13 ENCOUNTER — Encounter (HOSPITAL_COMMUNITY)
Admission: RE | Admit: 2018-07-13 | Discharge: 2018-07-13 | Disposition: A | Discharge status: Home / Self Care | Payer: Medicare HMO | Source: Ambulatory Visit | Attending: General Surgery | Admitting: General Surgery

## 2018-07-13 DIAGNOSIS — J449 Chronic obstructive pulmonary disease, unspecified: Secondary | ICD-10-CM | POA: Insufficient documentation

## 2018-07-13 DIAGNOSIS — F17210 Nicotine dependence, cigarettes, uncomplicated: Secondary | ICD-10-CM | POA: Insufficient documentation

## 2018-07-13 DIAGNOSIS — C119 Malignant neoplasm of nasopharynx, unspecified: Secondary | ICD-10-CM | POA: Insufficient documentation

## 2018-07-13 DIAGNOSIS — R1310 Dysphagia, unspecified: Secondary | ICD-10-CM | POA: Insufficient documentation

## 2018-07-13 DIAGNOSIS — Z5111 Encounter for antineoplastic chemotherapy: Secondary | ICD-10-CM | POA: Insufficient documentation

## 2018-07-13 DIAGNOSIS — F419 Anxiety disorder, unspecified: Secondary | ICD-10-CM | POA: Insufficient documentation

## 2018-07-13 DIAGNOSIS — C770 Secondary and unspecified malignant neoplasm of lymph nodes of head, face and neck: Secondary | ICD-10-CM | POA: Insufficient documentation

## 2018-07-13 DIAGNOSIS — Z79899 Other long term (current) drug therapy: Secondary | ICD-10-CM | POA: Insufficient documentation

## 2018-07-13 DIAGNOSIS — Z9081 Acquired absence of spleen: Secondary | ICD-10-CM | POA: Insufficient documentation

## 2018-07-13 MED ORDER — LIDOCAINE-PRILOCAINE 2.5-2.5 % EX CREA: TOPICAL_CREAM | CUTANEOUS | 3 refills | Status: DC

## 2018-07-13 MED ORDER — PROCHLORPERAZINE MALEATE 10 MG PO TABS: 10.0000 mg | ORAL_TABLET | Freq: Four times a day (QID) | ORAL | 1 refills | Status: DC | PRN

## 2018-07-13 NOTE — Patient Instructions (Addendum)
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  You are going to be treated with curative intent.  You will get chemotherapy and radiation concurrently for 6 weeks.  The doctor at that time will discuss with you the option of adjuvant therapy with chemotherapy for an additional 3 cycles.  You will start with weekly Cisplatin ( platinol).  This will be given through your port a cath.  If you get the adjuvant therapy after radiation you will get Cisplatin ( platinol) and fluorouracil (5 FU Adrucil).  This will be given through a pump that you will wear home for 4 days.  You will see the doctor regularly throughout treatment.  We monitor your lab work prior to every treatment. The doctor monitors your response to treatment by the way you are feeling, your blood work, and scans periodically.  There will be wait times while you are here for treatment.  It will take about 30 minutes to 1 hour for your lab work to result.  Then there will be wait times while pharmacy mixes your medications.   You will have pre-medications prior to receiving chemotherapy: Aloxi - high powered nausea/vomiting prevention medication used for chemotherapy patients.  Emend - high powered nausea/vomiting prevention medication used for chemotherapy patients.   Cisplatin (Generic Name) Other Names: Platinol, platinum  About This Drug Cisplatin is a drug used to treat cancer. This drug is given in the vein (IV).  This will take 1 hour to infuse.  With this drug you will receive 2 hours of pre-hydration and 2 hours of post hydration.  This is to help protect your kidneys.  You will have to urinate 250 mL prior to receiving this medication.  We will give you something to measure your urine in.   Possible Side Effects (More Common) . This drug may affect how your kidneys work. Your kidney function will be checked as needed. . Electrolyte changes. Your  blood will be checked for electrolyte changes as needed. . High-frequency hearing loss may occur. You will get IV fluids before and during the Cisplatin infusion to help prevent this. You may also get ringing in the ears. . Bone marrow depression. This is a decrease in the number of white blood cells, red blood cells, and platelets. This may raise your risk of infection, make you tired and weak (fatigue), and raise your risk of bleeding. . Nausea and throwing up (vomiting). These symptoms may happen within a few hours after your treatment and may last for a few days to a week. Medicines are available to stop or lessen these side effects.  Possible Side Effects (Less Common) . Effects on the nerves are called peripheral neuropathy. You may feel numbness or pain in your hands and feet. It may be hard for you to button your clothes, open jars, or walk as usual. The effect on the nerves may get worse with more doses of the drug. These effects get better in some people after the drug is stopped, but it does not get better in all people. Marland Kitchen Blurred vision or other changes in eyesight. . Soreness of the mouth and throat. You may have red areas, white patches, or sores that hurt. . Hair loss. You may notice your hair getting thin. Some patients lose their hair. Your hair often grows back when treatment is done.  Allergic Reactions Allergic reactions to this drug are rare, but may happen in  some patients. Signs of allergic reactions to this drug may be a rash, fever, chills, feeling dizzy, trouble breathing, and/or feeling that your heart is beating in a fast or not normal way.  Treating Side Effects . Drink 6-8 cups of fluids each day unless your doctor has told you to limit your fluid intake due to some other health problem. A cup is 8 ounces of fluid. If you throw up or have loose bowel movements you should drink more fluids so that you do not become dehydrated (lack water in the body due to losing too much  fluid). . If you have numbness and tingling in your hands and feet, be careful when cooking, walking, and handling sharp objects and hot liquids. . Mouth care is very important. Your mouth care should consist of routine, gentle cleaning of your teeth or dentures and rinsing your mouth with a mixture of 1/2 teaspoon of salt in 8 ounces of water or  teaspoon of baking soda in 8 ounces of water. This should be done at least after each meal and at bedtime. . If you have mouth sores, avoid mouthwash that has alcohol. Also avoid alcohol and smoking because they can bother your mouth and throat. . Talk with your nurse about getting a wig before you lose your hair. Also, call the [facility] at 800-ACS-2345 to find out information about the "Look Good, Feel Better" program close to where you live. It is a free program where women getting chemotherapy can learn about wigs, turbans and scarves as well as makeup techniques and skin and nail care.  Food and Drug Interactions There are no known interactions of Cisplatin with food. This drug may interact with other medicines. Tell your doctor and pharmacist about all the medicines and dietary supplements (vitamins, minerals, herbs and others) that you are taking at this time. The safety and use of dietary supplements and alternative diets are often not known. Using these might affect your cancer or interfere with your treatment. Until more is known, you should not use dietary supplements or alternative diets without your cancer doctor's help.  When to Call the Doctor Call your doctor or nurse right away if you have any of these symptoms: . Rash or itching . Feeling dizzy or lightheaded . Wheezing or trouble breathing . Swelling of the face . Fever of 100.5 F (38 C) or above . Chills . Easy bleeding or bruising . Decreased urine . Weight gain of 5 pounds in one week (fluid retention) . Nausea that stops you from eating or drinking . Throwing up  more than 3 times a day Call your doctor or nurse as soon as possible if you have these symptoms: . Numbness, tingling, decreased feeling or weakness in fingers, toes, arms, or legs . Trouble walking or changes in the way you walk, feeling clumsy when buttoning clothes, opening jars, or other routine hand motions . Blurred vision or other changes in eyesight . Changes in hearing, ringing in the ears . Pain in your mouth or throat that makes it hard to eat or drink . Fatigue that interferes with your daily activities  Sexual Problems and Reproductive Concerns . Infertility warning: Sexual problems and reproduction concerns may occur. In both men and women, this drug may affect your ability to have children. This cannot be determined before your treatment. Speak with your doctor or nurse if you plan to have children. Ask for information on sperm or egg banking. . In men, this drug  may interfere with your ability to make sperm, but it should not change your ability to have sexual relations. . In women, menstrual bleeding may become irregular or stop while you are receiving this drug. Do not assume that you cannot become pregnant if you do not have a menstrual period. . Women may experience signs of menopause like vaginal dryness, itching, and pain during sexual relations . Genetic counseling is available for you to talk about the effects of this drug therapy on future pregnancies. Also, a genetic counselor can look at the possible risk of problems in the unborn baby due to this medicine if an exposure happens during pregnancy. . Pregnancy warning: The drug may have harmful effects on the unborn child, so effective methods of birth control should be used during your cancer treatment. . Breast feeding warning: Women should not breast feed during treatment because this drug could enter the breast milk and badly harm a breast feeding baby.  5-Fluorouracil (Adrucil)   About This Drug Fluorouracil is  used to treat cancer. It is given in the vein (IV).   Possible Side Effects . Hair loss. Hair loss is often temporary, although with certain medicine, hair loss can sometimes be permanent. Hair loss may happen suddenly or gradually. If you lose hair, you may lose it from your head, face, armpits, pubic area, chest, and/or legs. You may also notice your hair getting thin. . Changes in your nail color, nail loss and/or brittle nail . Darkening of the skin, or changes to the color of your skin and/or veins used for infusion . Rash, itching . Nausea and throwing up (vomiting) . Loose bowel movements (diarrhea) . Ulcers - sores that may cause pain or bleeding in your digestive tract, which includes your mouth, esophagus, stomach, small/large intestines and rectum . Soreness of the mouth and throat. You may have red areas, white patches, or sores that hurt. . Decreased appetite (decreased hunger) . Changes in the tissue of the heart and/or heart attack. Some changes may happen that can cause your heart to have less ability to pump blood. . Bone marrow depression. This is a decrease in the number of white blood cells, red blood cells, and platelets. This may raise your risk of infection, make you tired and weak (fatigue), and raise your risk of bleeding . Sensitivity to light (photosensitivity). Photosensitivity means that you may become more sensitive to the sun and/or light. Your eyes may water more, mostly in bright light. . Allergic reaction: Allergic reactions, including anaphylaxis are rare but may happen in some patients. Signs of allergic reaction to this drug may be swelling of the face, feeling like your tongue or throat are swelling, trouble breathing, rash, itching, fever, chills, feeling dizzy, and/or feeling that your heart is beating in a fast or not normal way. If this happens, do not take another dose of this drug. You should get urgent medical treatment. . Blurred vision or other changes  in eyesight Note: Not all possible side effects are included above.   Warnings and Precautions . Hand-and-foot syndrome. The palms of your hands or soles of your feet may tingle, become numb, painful, swollen, or red. . Changes in your central nervous system can happen. The central nervous system is made up of your brain and spinal cord. You could feel extreme tiredness, agitation, confusion, hallucinations (see or hear things that are not there), trouble understanding or speaking, loss of control of your bowels or bladder, eyesight changes, numbness or lack of  strength to your arms, legs, face, or body, and coma. If you start to have any of these symptoms let your doctor know right away. . Side effects of this drug may be unexpectedly severe in some patients Note: Some of the side effects above are very rare. If you have concerns and/or questions, please discuss them with your medical team.   Important Information . This drug may be present in the saliva, tears, sweat, urine, stool, vomit, semen, and vaginal secretions. Talk to your doctor and/or your nurse about the necessary precautions to take during this time.   Treating Side Effects . To help with hair loss, wash with a mild shampoo and avoid washing your hair every day. . Avoid rubbing your scalp, pat your hair or scalp dry. . Avoid coloring your hair. . Limit your use of hair spray, electric curlers, blow dryers, and curling irons. . If you are interested in getting a wig, talk to your nurse. You can also call the [facility] at 800-ACS-2345 to find out information about the "Look Good,Feel Better" program close to where you live. It is a free program where women getting chemotherapy can learn about wigs, turbans and scarves as well as makeup techniques and skin and nail care. Marland Kitchen Keeping your nails moisturized may help with brittleness. . To help with itching, moisturize your skin several times day. . Avoid sun exposure and  apply sunscreen routinely when outdoors. . If you get a rash do not put anything on it unless your doctor or nurse says you may. Keep the area around the rash clean and dry. Ask your doctor for medicine if your rash bothers you. . To help with decreased appetite, eat small, frequent meals. . Eat high caloric food such as pudding, ice cream, yogurt and milkshakes. . Drink plenty of fluids (a minimum of eight glasses per day is recommended). . If you throw up or have loose bowel movements, you should drink more fluids so that you do not become dehydrated (lack water in the body from losing too much fluid). . To help with nausea and vomiting, eat small, frequent meals instead of three large meals a day. Choose foods and drinks that are at room temperature. Ask your nurse or doctor about other helpful tips and medicine that is available to help or stop lessen these symptoms. . If you get diarrhea, eat low-fiber foods that are high in protein and calories and avoid foods that can irritate your digestive tracts or lead to cramping. . Ask your nurse or doctor about medicine that can lessen or stop your diarrhea. . Mouth care is very important. Your mouth care should consist of routine, gentle cleaning of your teeth or dentures and rinsing your mouth with a mixture of 1/2 teaspoon of salt in 8 ounces of water or  teaspoon of baking soda in 8 ounces of water. This should be done at least after each meal and at bedtime. . If you have mouth sores, avoid mouthwash that has alcohol. Also avoid alcohol and smoking because they can bother your mouth and throat. . Manage tiredness by pacing your activities for the day. . Be sure to include periods of rest between energy-draining activities. . To help decrease your risk of infections, wash your hands regularly. . Avoid close contact with people who have a cold, the flu, or other infections. . Use a soft toothbrush. Check with your nurse before using dental floss. .  Be very careful when using knives or  tools. . Use an electric shaver instead of a razor.   Food and Drug Interactions . There are no known interactions of fluorouracil with food. . Check with your doctor or pharmacist about all other prescription medicines and dietary supplements you are taking before starting this medicine as there are a lot of known drug interactions with fluorouracil. Also, check with your doctor or pharmacist before starting any new prescription or over-the-counter medicines, or dietary supplement to make sure that there are no interactions.   When to Call the Doctor Call your doctor or nurse if you have any of these symptoms and/or any new or unusual symptoms: . Fever of 100.5 F (38 C) or higher . Chills . Easy bleeding or bruising . Trouble breathing . Feeling dizzy or lightheaded . Feeling that your heart is beating in a fast or not normal way (palpitations) . Chest pain or symptoms of a heart attack. Most heart attacks involve pain in the center of the chest that lasts more than a few minutes. The pain may go away and come back or it can be constant. It can feel like pressure, squeezing, fullness, or pain. Sometimes pain is felt in one or both arms, the back, neck, jaw, or stomach. If any of these symptoms last 2 minutes, call 911. Marland Kitchen Confusion and/or agitation . Hallucinations . Trouble understanding or speaking . Blurry vision or changes in your eyesight . Numbness or lack of strength to your arms, legs, face, or body . Nausea that stops you from eating or drinking and/or is not relieved by prescribed medicines . Throwing up more than 3 times a day . Loose bowel movements (diarrhea) 4 times a day or loose bowel movements with lack of strength or a feeling of being dizzy . Lasting loss of appetite or rapid weight loss of five pounds in a week . Pain in your mouth or throat that makes it hard to eat or drink . Pain along the digestive tract - especially if worse  after eating . Blood in your vomit (bright red or coffee-ground) and/or stools (bright red, or black/tarry) . Coughing up blood . Fatigue that interferes with your daily activities . Painful, red, or swollen areas on your hands or feet . Numbness and/or tingling of your hands and/or feet . Signs of allergic reaction: swelling of the face, feeling like your tongue or throat are swelling, trouble breathing, rash, itching, fever, chills, feeling dizzy, and/or feeling that your heart is beating in a fast or not normal way . If you think you are pregnant or may have impregnated your partner   Reproduction Warnings . Pregnancy warning: This drug may have harmful effects on the unborn baby. Women of child bearing potential should use effective methods of birth control during your cancer treatment. Let your doctor know right away if you think you may be pregnant. . Breastfeeding warning: It is not known if this drug passes into breast milk. For this reason, women should talk to their doctor about the risks and benefits of breast feeding during treatment with this drug because this drug may enter the breast milk and cause harm to a breast feeding baby. . Fertility warning: In men and women both, this drug may affect your ability to have children in the future. Talk with your doctor or nurse if you plan to have children. Ask for information on sperm or egg banking.  SELF CARE ACTIVITIES WHILE ON CHEMOTHERAPY:  Hydration Increase your fluid intake 48 hours prior to  treatment and drink at least 8 to 12 cups (64 ounces) of water/decaffeinated beverages per day after treatment. You can still have your cup of coffee or soda but these beverages do not count as part of your 8 to 12 cups that you need to drink daily. No alcohol intake.  Medications Continue taking your normal prescription medication as prescribed.  If you start any new herbal or new supplements please let us know first to make sure it is  safe.  Mouth Care Have teeth cleaned professionally before starting treatment. Keep dentures and partial plates clean. Use soft toothbrush and do not use mouthwashes that contain alcohol. Biotene is a good mouthwash that is available at most pharmacies or may be ordered by calling [phone]. Use warm salt water gargles (1 teaspoon salt per 1 quart warm water) before and after meals and at bedtime. Or you may rinse with 2 tablespoons of three-percent hydrogen peroxide mixed in eight ounces of water. If you are still having problems with your mouth or sores in your mouth please call the clinic. If you need dental work, please let the doctor know before you go for your appointment so that we can coordinate the best possible time for you in regards to your chemo regimen. You need to also let your dentist know that you are actively taking chemo. We may need to do labs prior to your dental appointment.  Skin Care Always use sunscreen that has not expired and with SPF (Sun Protection Factor) of 50 or higher. Wear hats to protect your head from the sun. Remember to use sunscreen on your hands, ears, face, & feet.  Use good moisturizing lotions such as udder cream, eucerin, or even Vaseline. Some chemotherapies can cause dry skin, color changes in your skin and nails.    . Avoid long, hot showers or baths. . Use gentle, fragrance-free soaps and laundry detergent. . Use moisturizers, preferably creams or ointments rather than lotions because the thicker consistency is better at preventing skin dehydration. Apply the cream or ointment within 15 minutes of showering. Reapply moisturizer at night, and moisturize your hands every time after you wash them.  Hair Loss (if your doctor says your hair will fall out)  . If your doctor says that your hair is likely to fall out, decide before you begin chemo whether you want to wear a wig. You may want to shop before treatment to match your hair color. . Hats,  turbans, and scarves can also camouflage hair loss, although some people prefer to leave their heads uncovered. If you go bare-headed outdoors, be sure to use sunscreen on your scalp. . Cut your hair short. It eases the inconvenience of shedding lots of hair, but it also can reduce the emotional impact of watching your hair fall out. . Don't perm or color your hair during chemotherapy. Those chemical treatments are already damaging to hair and can enhance hair loss. Once your chemo treatments are done and your hair has grown back, it's OK to resume dyeing or perming hair. With chemotherapy, hair loss is almost always temporary. But when it grows back, it may be a different color or texture. In older adults who still had hair color before chemotherapy, the new growth may be completely gray.  Often, new hair is very fine and soft.  Infection Prevention Please wash your hands for at least 30 seconds using warm soapy water. Handwashing is the #1 way to prevent the spread of germs. Stay away from  sick people or people who are getting over a cold. If you develop respiratory systems such as green/yellow mucus production or productive cough or persistent cough let us know and we will see if you need an antibiotic. It is a good idea to keep a pair of gloves on when going into grocery stores/[employer] to decrease your risk of coming into contact with germs on the carts, etc. Carry alcohol hand gel with you at all times and use it frequently if out in public. If your temperature reaches 100.5 or higher please call the clinic and let us know.  If it is after hours or on the weekend please go to the ER if your temperature is over 100.5.  Please have your own personal thermometer at home to use.    Sex and bodily fluids If you are going to have sex, a condom must be used to protect the person that isn't taking chemotherapy. Chemo can decrease your libido (sex drive). For a few days after chemotherapy, chemotherapy can be  excreted through your bodily fluids.  When using the toilet please close the lid and flush the toilet twice.  Do this for a few day after you have had chemotherapy.   Effects of chemotherapy on your sex life Some changes are simple and won't last long. They won't affect your sex life permanently. Sometimes you may feel: . too tired . not strong enough to be very active . sick or sore  . not in the mood . anxious or low Your anxiety might not seem related to sex. For example, you may be worried about the cancer and how your treatment is going. Or you may be worried about money, or about how you family are coping with your illness. These things can cause stress, which can affect your interest in sex. It's important to talk to your partner about how you feel. Remember - the changes to your sex life don't usually last long. There's usually no medical reason to stop having sex during chemo. The drugs won't have any long term physical effects on your performance or enjoyment of sex. Cancer can't be passed on to your partner during sex  Contraception It's important to use reliable contraception during treatment. Avoid getting pregnant while you or your partner are having chemotherapy. This is because the drugs may harm the baby. Sometimes chemotherapy drugs can leave a man or woman infertile.  This means you would not be able to have children in the future. You might want to talk to someone about permanent infertility. It can be very difficult to learn that you may no longer be able to have children. Some people find counselling helpful. There might be ways to preserve your fertility, although this is easier for men than for women. You may want to speak to a fertility expert. You can talk about sperm banking or harvesting your eggs. You can also ask about other fertility options, such as donor eggs. If you have or have had breast cancer, your doctor might advise you not to take the contraceptive pill. This  is because the hormones in it might affect the cancer.  It is not known for sure whether or not chemotherapy drugs can be passed on through semen or secretions from the vagina. Because of this some doctors advise people to use a barrier method if you have sex during treatment. This applies to vaginal, anal or oral sex. Generally, doctors advise a barrier method only for the time you are actually  having the treatment and for about a week after your treatment. Advice like this can be worrying, but this does not mean that you have to avoid being intimate with your partner. You can still have close contact with your partner and continue to enjoy sex.  Animals If you have cats or birds we just ask that you not change the litter or change the cage.  Please have someone else do this for you while you are on chemotherapy.   Food Safety During and After Cancer Treatment Food safety is important for people both during and after cancer treatment. Cancer and cancer treatments, such as chemotherapy, radiation therapy, and stem cell/bone marrow transplantation, often weaken the immune system. This makes it harder for your body to protect itself from foodborne illness, also called food poisoning. Foodborne illness is caused by eating food that contains harmful bacteria, parasites, or viruses.  Foods to avoid Some foods have a higher risk of becoming tainted with bacteria. These include: Marland Kitchen Unwashed fresh fruit and vegetables, especially leafy vegetables that can hide dirt and other contaminants . Raw sprouts, such as alfalfa sprouts . Raw or undercooked beef, especially ground beef, or other raw or undercooked meat and poultry . Fatty, fried, or spicy foods immediately before or after treatment.  These can sit heavy on your stomach and make you feel nauseous. . Raw or undercooked shellfish, such as oysters. . Sushi and sashimi, which often contain raw fish.  . Unpasteurized beverages, such as unpasteurized fruit  juices, raw milk, raw yogurt, or cider . Undercooked eggs, such as soft boiled, over easy, and poached; raw, unpasteurized eggs; or foods made with raw egg, such as homemade raw cookie dough and homemade mayonnaise Simple steps for food safety Shop smart. . Do not buy food stored or displayed in an unclean area. . Do not buy bruised or damaged fruits or vegetables. . Do not buy cans that have cracks, dents, or bulges. . Pick up foods that can spoil at the end of your shopping trip and store them in a cooler on the way home. Prepare and clean up foods carefully. . Rinse all fresh fruits and vegetables under running water, and dry them with a clean towel or paper towel. . Clean the top of cans before opening them. . After preparing food, wash your hands for 20 seconds with hot water and soap. Pay special attention to areas between fingers and under nails. . Clean your utensils and dishes with hot water and soap. Marland Kitchen Disinfect your kitchen and cutting boards using 1 teaspoon of liquid, unscented bleach mixed into 1 quart of water.   Dispose of old food. . Eat canned and packaged food before its expiration date (the "use by" or "best before" date). . Consume refrigerated leftovers within 3 to 4 days. After that time, throw out the food. Even if the food does not smell or look spoiled, it still may be unsafe. Some bacteria, such as Listeria, can grow even on foods stored in the refrigerator if they are kept for too long. Take precautions when eating out. . At restaurants, avoid buffets and salad bars where food sits out for a long time and comes in contact with many people. Food can become contaminated when someone with a virus, often a norovirus, or another "bug" handles it. . Put any leftover food in a "to-go" container yourself, rather than having the server do it. And, refrigerate leftovers as soon as you get home. . Choose restaurants that are  clean and that are willing to prepare your food as you  order it cooked.   MEDICATIONS:                                                                                                                                                                Compazine/Prochlorperazine 10mg  tablet. Take 1 tablet every 6 hours as needed for nausea/vomiting. (This can make you sleepy)   EMLA cream. Apply a quarter size amount to port site 1 hour prior to chemo. Do not rub in. Cover with plastic wrap.   Over-the-Counter Meds:  Colace - 100 mg capsules - take 2 capsules daily.  If this doesn't help then you can increase to 2 capsules twice daily.  Call us if this does not help your bowels move.   Imodium 2mg  capsule. Take 2 capsules after the 1st loose stool and then 1 capsule every 2 hours until you go a total of 12 hours without having a loose stool. Call the [facility] if loose stools continue. If diarrhea occurs at bedtime, take 2 capsules at bedtime. Then take 2 capsules every 4 hours until morning. Call [facility].    Diarrhea Sheet   If you are having loose stools/diarrhea, please purchase Imodium and begin taking as outlined:  At the first sign of poorly formed or loose stools you should begin taking Imodium (loperamide) 2 mg capsules.  Take two caplets (4mg ) followed by one caplet (2mg ) every 2 hours until you have had no diarrhea for 12 hours.  During the night take two caplets (4mg ) at bedtime and continue every 4 hours during the night until the morning.  Stop taking Imodium only after there is no sign of diarrhea for 12 hours.    Always call the [facility] if you are having loose stools/diarrhea that you can't get under control.  Loose stools/diarrhea leads to dehydration (loss of water) in your body.  We have other options of trying to get the loose stools/diarrhea to stop but you must let us know!   Constipation Sheet  Colace - 100 mg capsules - take 2 capsules daily.  If this doesn't help then you can increase to 2 capsules twice  daily.  Please call if the above does not work for you.   Do not go more than 2 days without a bowel movement.  It is very important that you do not become constipated.  It will make you feel sick to your stomach (nausea) and can cause abdominal pain and vomiting.   Nausea Sheet   Compazine/Prochlorperazine 10mg  tablet. Take 1 tablet every 6 hours as needed for nausea/vomiting. (This can make you sleepy)  If you are having persistent nausea (nausea that does not stop) please call the [facility] and let  us know the amount of nausea that you are experiencing.  If you begin to vomit, you need to call the [facility] and if it is the weekend and you have vomited more than one time and can't get it to stop-go to the Emergency Room.  Persistent nausea/vomiting can lead to dehydration (loss of fluid in your body) and will make you feel terrible.   Ice chips, sips of clear liquids, foods that are @ room temperature, crackers, and toast tend to be better tolerated.   SYMPTOMS TO REPORT AS SOON AS POSSIBLE AFTER TREATMENT:   FEVER GREATER THAN 100.5 F  CHILLS WITH OR WITHOUT FEVER  NAUSEA AND VOMITING THAT IS NOT CONTROLLED WITH YOUR NAUSEA MEDICATION  UNUSUAL SHORTNESS OF BREATH  UNUSUAL BRUISING OR BLEEDING  TENDERNESS IN MOUTH AND THROAT WITH OR WITHOUT PRESENCE OF ULCERS  URINARY PROBLEMS  BOWEL PROBLEMS  UNUSUAL RASH      Wear comfortable clothing and clothing appropriate for easy access to any Portacath or PICC line. Let us know if there is anything that we can do to make your therapy better!    What to do if you need assistance after hours or on the weekends: CALL [phone].  HOLD on the line, do not hang up.  You will hear multiple messages but at the end you will be connected with a nurse triage line.  They will contact the doctor if necessary.  Most of the time they will be able to assist you.  Do not call the hospital operator.      I have been informed and  understand all of the instructions given to me and have received a copy. I have been instructed to call the clinic [phone] or my family physician as soon as possible for continued medical care, if indicated. I do not have any more questions at this time but understand that I may call the [facility] or the Patient Navigator at [phone] during office hours should I have questions or need assistance in obtaining follow-up care.

## 2018-07-13 NOTE — Progress Notes (Signed)
Chemotherapy teaching pulled together. 

## 2018-07-16 ENCOUNTER — Ambulatory Visit (HOSPITAL_COMMUNITY)
Admission: RE | Admit: 2018-07-16 | Discharge: 2018-07-16 | Disposition: A | Discharge status: Home / Self Care | Payer: Medicare HMO | Source: Ambulatory Visit | Attending: General Surgery | Admitting: General Surgery

## 2018-07-16 ENCOUNTER — Encounter (HOSPITAL_COMMUNITY): Payer: Self-pay | Admitting: Anesthesiology

## 2018-07-16 ENCOUNTER — Ambulatory Visit (HOSPITAL_COMMUNITY): Payer: Medicare HMO

## 2018-07-16 ENCOUNTER — Encounter (HOSPITAL_COMMUNITY)
Admission: RE | Disposition: A | Discharge status: Home / Self Care | Payer: Self-pay | Source: Ambulatory Visit | Attending: General Surgery

## 2018-07-16 ENCOUNTER — Ambulatory Visit (HOSPITAL_COMMUNITY): Payer: Medicare HMO | Admitting: Anesthesiology

## 2018-07-16 DIAGNOSIS — Z79899 Other long term (current) drug therapy: Secondary | ICD-10-CM | POA: Insufficient documentation

## 2018-07-16 DIAGNOSIS — J449 Chronic obstructive pulmonary disease, unspecified: Secondary | ICD-10-CM | POA: Insufficient documentation

## 2018-07-16 DIAGNOSIS — F17210 Nicotine dependence, cigarettes, uncomplicated: Secondary | ICD-10-CM | POA: Insufficient documentation

## 2018-07-16 DIAGNOSIS — F419 Anxiety disorder, unspecified: Secondary | ICD-10-CM | POA: Insufficient documentation

## 2018-07-16 DIAGNOSIS — Z95828 Presence of other vascular implants and grafts: Secondary | ICD-10-CM

## 2018-07-16 DIAGNOSIS — M1990 Unspecified osteoarthritis, unspecified site: Secondary | ICD-10-CM | POA: Diagnosis not present

## 2018-07-16 DIAGNOSIS — C119 Malignant neoplasm of nasopharynx, unspecified: Secondary | ICD-10-CM | POA: Diagnosis present

## 2018-07-16 HISTORY — PX: PORTACATH PLACEMENT: SHX2246

## 2018-07-16 SURGERY — INSERTION, TUNNELED CENTRAL VENOUS DEVICE, WITH PORT
Anesthesia: Monitor Anesthesia Care | Site: Chest | Laterality: Left

## 2018-07-16 MED ORDER — SUGAMMADEX SODIUM 200 MG/2ML IV SOLN
INTRAVENOUS | Status: AC
  Filled 2018-07-16: qty 2

## 2018-07-16 MED ORDER — LIDOCAINE HCL (PF) 1 % IJ SOLN
INTRAMUSCULAR | Status: AC
  Filled 2018-07-16: qty 5

## 2018-07-16 MED ORDER — HYDROMORPHONE HCL 1 MG/ML IJ SOLN: 0.2500 mg | INTRAMUSCULAR | Status: DC | PRN

## 2018-07-16 MED ORDER — HEPARIN SOD (PORK) LOCK FLUSH 100 UNIT/ML IV SOLN
INTRAVENOUS | Status: DC | PRN
  Administered 2018-07-16: 500 [IU] via INTRAVENOUS

## 2018-07-16 MED ORDER — FENTANYL CITRATE (PF) 100 MCG/2ML IJ SOLN
INTRAMUSCULAR | Status: AC
  Filled 2018-07-16: qty 2

## 2018-07-16 MED ORDER — ROCURONIUM BROMIDE 50 MG/5ML IV SOLN
INTRAVENOUS | Status: AC
  Filled 2018-07-16: qty 1

## 2018-07-16 MED ORDER — CHLORHEXIDINE GLUCONATE CLOTH 2 % EX PADS: 6.0000 | MEDICATED_PAD | Freq: Once | CUTANEOUS | Status: DC

## 2018-07-16 MED ORDER — MIDAZOLAM HCL 2 MG/2ML IJ SOLN: 0.5000 mg | Freq: Once | INTRAMUSCULAR | Status: DC | PRN

## 2018-07-16 MED ORDER — LIDOCAINE HCL (PF) 1 % IJ SOLN
INTRAMUSCULAR | Status: AC
  Filled 2018-07-16: qty 30

## 2018-07-16 MED ORDER — HYDROCODONE-ACETAMINOPHEN 7.5-325 MG PO TABS: 1.0000 | ORAL_TABLET | Freq: Once | ORAL | Status: DC | PRN

## 2018-07-16 MED ORDER — KETOROLAC TROMETHAMINE 30 MG/ML IJ SOLN
30.0000 mg | Freq: Once | INTRAMUSCULAR | Status: AC
  Administered 2018-07-16: 30 mg via INTRAVENOUS
  Filled 2018-07-16: qty 1

## 2018-07-16 MED ORDER — HEPARIN SOD (PORK) LOCK FLUSH 100 UNIT/ML IV SOLN
INTRAVENOUS | Status: AC
  Filled 2018-07-16: qty 5

## 2018-07-16 MED ORDER — LIDOCAINE HCL (PF) 1 % IJ SOLN
INTRAMUSCULAR | Status: DC | PRN
  Administered 2018-07-16: 10 mL

## 2018-07-16 MED ORDER — SUCCINYLCHOLINE CHLORIDE 20 MG/ML IJ SOLN
INTRAMUSCULAR | Status: AC
  Filled 2018-07-16: qty 1

## 2018-07-16 MED ORDER — GLYCOPYRROLATE PF 0.2 MG/ML IJ SOSY
PREFILLED_SYRINGE | INTRAMUSCULAR | Status: DC | PRN
  Administered 2018-07-16: .2 mg via INTRAVENOUS

## 2018-07-16 MED ORDER — PROMETHAZINE HCL 25 MG/ML IJ SOLN: 6.2500 mg | INTRAMUSCULAR | Status: DC | PRN

## 2018-07-16 MED ORDER — LACTATED RINGERS IV SOLN
INTRAVENOUS | Status: DC | PRN
  Administered 2018-07-16: 11:00:00 via INTRAVENOUS

## 2018-07-16 MED ORDER — PROPOFOL 10 MG/ML IV BOLUS
INTRAVENOUS | Status: AC
  Filled 2018-07-16: qty 20

## 2018-07-16 MED ORDER — CEFAZOLIN SODIUM-DEXTROSE 2-4 GM/100ML-% IV SOLN
2.0000 g | INTRAVENOUS | Status: AC
  Administered 2018-07-16: 2 g via INTRAVENOUS

## 2018-07-16 MED ORDER — GLYCOPYRROLATE 0.2 MG/ML IJ SOLN
INTRAMUSCULAR | Status: AC
  Filled 2018-07-16: qty 1

## 2018-07-16 MED ORDER — MIDAZOLAM HCL 2 MG/2ML IJ SOLN
INTRAMUSCULAR | Status: AC
  Filled 2018-07-16: qty 2

## 2018-07-16 MED ORDER — MIDAZOLAM HCL 5 MG/5ML IJ SOLN
INTRAMUSCULAR | Status: DC | PRN
  Administered 2018-07-16 (×2): 1 mg via INTRAVENOUS

## 2018-07-16 MED ORDER — SODIUM CHLORIDE 0.9 % IV SOLN
INTRAVENOUS | Status: AC | PRN
  Administered 2018-07-16: 500 mL via INTRAMUSCULAR

## 2018-07-16 MED ORDER — CEFAZOLIN SODIUM-DEXTROSE 2-4 GM/100ML-% IV SOLN
INTRAVENOUS | Status: AC
  Filled 2018-07-16: qty 100

## 2018-07-16 MED ORDER — FENTANYL CITRATE (PF) 100 MCG/2ML IJ SOLN
INTRAMUSCULAR | Status: DC | PRN
  Administered 2018-07-16 (×4): 25 ug via INTRAVENOUS

## 2018-07-16 SURGICAL SUPPLY — 29 items
BAG DECANTER FOR FLEXI CONT (MISCELLANEOUS) ×3 IMPLANT
CHLORAPREP W/TINT 10.5 ML (MISCELLANEOUS) ×3 IMPLANT
CLOTH BEACON ORANGE TIMEOUT ST (SAFETY) ×3 IMPLANT
COVER LIGHT HANDLE STERIS (MISCELLANEOUS) ×6 IMPLANT
DECANTER SPIKE VIAL GLASS SM (MISCELLANEOUS) ×3 IMPLANT
DERMABOND ADVANCED (GAUZE/BANDAGES/DRESSINGS) ×2
DERMABOND ADVANCED .7 DNX12 (GAUZE/BANDAGES/DRESSINGS) ×1 IMPLANT
DRAPE C-ARM FOLDED MOBILE STRL (DRAPES) ×3 IMPLANT
ELECT REM PT RETURN 9FT ADLT (ELECTROSURGICAL) ×3
ELECTRODE REM PT RTRN 9FT ADLT (ELECTROSURGICAL) ×1 IMPLANT
GLOVE BIO SURGEON STRL SZ7 (GLOVE) ×3 IMPLANT
GLOVE BIOGEL PI IND STRL 7.0 (GLOVE) ×2 IMPLANT
GLOVE BIOGEL PI INDICATOR 7.0 (GLOVE) ×4
GLOVE SURG SS PI 7.5 STRL IVOR (GLOVE) ×3 IMPLANT
GOWN STRL REUS W/TWL LRG LVL3 (GOWN DISPOSABLE) ×6 IMPLANT
IV NS 500ML (IV SOLUTION) ×2
IV NS 500ML BAXH (IV SOLUTION) ×1 IMPLANT
KIT PORT POWER 8FR ISP MRI (Port) ×3 IMPLANT
KIT TURNOVER KIT A (KITS) ×3 IMPLANT
NEEDLE HYPO 25X1 1.5 SAFETY (NEEDLE) ×3 IMPLANT
PACK MINOR (CUSTOM PROCEDURE TRAY) ×3 IMPLANT
PAD ARMBOARD 7.5X6 YLW CONV (MISCELLANEOUS) ×3 IMPLANT
SET BASIN LINEN APH (SET/KITS/TRAYS/PACK) ×3 IMPLANT
SUT MNCRL AB 4-0 PS2 18 (SUTURE) ×3 IMPLANT
SUT VIC AB 3-0 SH 27 (SUTURE) ×2
SUT VIC AB 3-0 SH 27X BRD (SUTURE) ×1 IMPLANT
SYR 20CC LL (SYRINGE) ×3 IMPLANT
SYR 5ML LL (SYRINGE) ×3 IMPLANT
SYR CONTROL 10ML LL (SYRINGE) ×3 IMPLANT

## 2018-07-16 NOTE — Addendum Note (Signed)
Addendum  created 07/16/18 1427 by Mickel Baas, CRNA   Sign clinical note, SmartForm saved

## 2018-07-16 NOTE — Op Note (Signed)
Patient:  Zachary Dickerson.  DOB:  1961-10-07  MRN:  [id number]   Preop Diagnosis: Nasopharyngeal carcinoma  Postop Diagnosis: Same  Procedure: Port-A-Cath insertion  Surgeon: Aviva Signs, MD  Anes: MAC  Indications: Patient is a 57 year old white male who presents for Port-A-Cath insertion.  He is about to undergo chemotherapy for nasopharyngeal carcinoma.  The risks and benefits of the procedure including bleeding, infection, and pneumothorax were fully explained to the patient, who gave informed consent.  Procedure note: The patient was placed in the Trendelenburg position after the left upper chest was prepped and draped using the usual sterile technique with DuraPrep.  Surgical site confirmation was performed.  1% Xylocaine was used for local anesthesia.  An incision was made below the left clavicle. A subcutaneous pocket was formed.  A needle was advanced into the left subclavian vein using the Seldinger technique without difficulty.  A guidewire was then advanced into the right atrium under fluoroscopic guidance.  An introducer and peel-away sheath were placed over the guidewire.  The catheter was then inserted through the peel-away sheath and the peel-away sheath was removed.  The catheter was then attached to the port and the port placed in subcutaneous pocket.  Adequate positioning was confirmed by fluoroscopy.  Good backflow blood was noted on aspiration of the port.  Port was flushed with heparin flush.  Subcutaneous layer was reapproximated using 3-0 Vicryl interrupted suture.  The skin was closed using a 4-0 Monocryl subcuticular suture.  Dermabond was applied.  All tape and needle counts were correct at the end of the procedure.  The patient was awakened and transferred to PACU in stable condition.  A chest x-ray will be performed at that time.  Complications: None  EBL: Minimal  Specimen: None

## 2018-07-16 NOTE — Anesthesia Preprocedure Evaluation (Addendum)
Anesthesia Evaluation  Patient identified by MRN, date of birth, ID band Patient awake    Reviewed: Allergy & Precautions, NPO status , Patient's Chart, lab work & pertinent test results  Airway Mallampati: II  TM Distance: >3 FB Neck ROM: Full    Dental no notable dental hx. 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  Per scan no intracranial tumor  R eye blindness negative neurological ROS  negative psych ROS   GI/Hepatic negative GI ROS, Neg liver ROS,   Endo/Other  negative endocrine ROS  Renal/GU negative Renal ROS  negative genitourinary   Musculoskeletal negative musculoskeletal ROS (+) Arthritis ,   Abdominal   Peds negative pediatric ROS (+)  Hematology negative hematology ROS (+) Sickle cell anemia ,   Anesthesia Other Findings   Reproductive/Obstetrics negative OB ROS                             Anesthesia Physical Anesthesia Plan  ASA: III  Anesthesia Plan: MAC   Post-op Pain Management:    Induction: Intravenous  PONV Risk Score and Plan:   Airway Management Planned: Nasal Cannula and Simple Face Mask  Additional Equipment:   Intra-op Plan:   Post-operative Plan:   Informed Consent: I have reviewed the patients History and Physical, chart, labs and discussed the procedure including the risks, benefits and alternatives for the proposed anesthesia with the patient or authorized representative who has indicated his/her understanding and acceptance.   Dental advisory given  Plan Discussed with: CRNA  Anesthesia Plan Comments:         Anesthesia Quick Evaluation

## 2018-07-16 NOTE — Discharge Instructions (Signed)
Implanted Port Home Guide °An implanted port is a type of central line that is placed under the skin. Central lines are used to provide IV access when treatment or nutrition needs to be given through a person’s veins. Implanted ports are used for long-term IV access. An implanted port may be placed because: °· You need IV medicine that would be irritating to the small veins in your hands or arms. °· You need long-term IV medicines, such as antibiotics. °· You need IV nutrition for a long period. °· You need frequent blood draws for lab tests. °· You need dialysis. ° °Implanted ports are usually placed in the chest area, but they can also be placed in the upper arm, the abdomen, or the leg. An implanted port has two main parts: °· Reservoir. The reservoir is round and will appear as a small, raised area under your skin. The reservoir is the part where a needle is inserted to give medicines or draw blood. °· Catheter. The catheter is a thin, flexible tube that extends from the reservoir. The catheter is placed into a large vein. Medicine that is inserted into the reservoir goes into the catheter and then into the vein. ° °How will I care for my incision site? °Do not get the incision site wet. Bathe or shower as directed by your health care provider. °How is my port accessed? °Special steps must be taken to access the port: °· Before the port is accessed, a numbing cream can be placed on the skin. This helps numb the skin over the port site. °· Your health care provider uses a sterile technique to access the port. °? Your health care provider must put on a mask and sterile gloves. °? The skin over your port is cleaned carefully with an antiseptic and allowed to dry. °? The port is gently pinched between sterile gloves, and a needle is inserted into the port. °· Only "non-coring" port needles should be used to access the port. Once the port is accessed, a blood return should be checked. This helps ensure that the port  is in the vein and is not clogged. °· If your port needs to remain accessed for a constant infusion, a clear (transparent) bandage will be placed over the needle site. The bandage and needle will need to be changed every week, or as directed by your health care provider. °· Keep the bandage covering the needle clean and dry. Do not get it wet. Follow your health care provider’s instructions on how to take a shower or bath while the port is accessed. °· If your port does not need to stay accessed, no bandage is needed over the port. ° °What is flushing? °Flushing helps keep the port from getting clogged. Follow your health care provider’s instructions on how and when to flush the port. Ports are usually flushed with saline solution or a medicine called heparin. The need for flushing will depend on how the port is used. °· If the port is used for intermittent medicines or blood draws, the port will need to be flushed: °? After medicines have been given. °? After blood has been drawn. °? As part of routine maintenance. °· If a constant infusion is running, the port may not need to be flushed. ° °How long will my port stay implanted? °The port can stay in for as long as your health care provider thinks it is needed. When it is time for the port to come out, surgery will be   done to remove it. The procedure is similar to the one performed when the port was put in. °When should I seek immediate medical care? °When you have an implanted port, you should seek immediate medical care if: °· You notice a bad smell coming from the incision site. °· You have swelling, redness, or drainage at the incision site. °· You have more swelling or pain at the port site or the surrounding area. °· You have a fever that is not controlled with medicine. ° °This information is not intended to replace advice given to you by your health care provider. Make sure you discuss any questions you have with your health care provider. °Document  Released: 09/26/2005 Document Revised: 03/03/2016 Document Reviewed: 06/03/2013 °Elsevier Interactive Patient Education © 2017 [employer] °Implanted Port Insertion, Care After °This sheet gives you information about how to care for yourself after your procedure. Your health care provider may also give you more specific instructions. If you have problems or questions, contact your health care provider. °What can I expect after the procedure? °After your procedure, it is common to have: °· Discomfort at the port insertion site. °· Bruising on the skin over the port. This should improve over 3-4 days. ° °Follow these instructions at home: °Port care °· After your port is placed, you will get a manufacturer's information card. The card has information about your port. Keep this card with you at all times. °· Take care of the port as told by your health care provider. Ask your health care provider if you or a family member can get training for taking care of the port at home. A home health care nurse may also take care of the port. °· Make sure to remember what type of port you have. °Incision care °· Follow instructions from your health care provider about how to take care of your port insertion site. Make sure you: °? Wash your hands with soap and water before you change your bandage (dressing). If soap and water are not available, use hand sanitizer. °? Change your dressing as told by your health care provider. °? Leave stitches (sutures), skin glue, or adhesive strips in place. These skin closures may need to stay in place for 2 weeks or longer. If adhesive strip edges start to loosen and curl up, you may trim the loose edges. Do not remove adhesive strips completely unless your health care provider tells you to do that. °· Check your port insertion site every day for signs of infection. Check for: °? More redness, swelling, or pain. °? More fluid or blood. °? Warmth. °? Pus or a bad smell. °General  instructions °· Do not take baths, swim, or use a hot tub until your health care provider approves. °· Do not lift anything that is heavier than 10 lb (4.5 kg) for a week, or as told by your health care provider. °· Ask your health care provider when it is okay to: °? Return to work or school. °? Resume usual physical activities or sports. °· Do not drive for 24 hours if you were given a medicine to help you relax (sedative). °· Take over-the-counter and prescription medicines only as told by your health care provider. °· Wear a medical alert bracelet in case of an emergency. This will tell any health care providers that you have a port. °· Keep all follow-up visits as told by your health care provider. This is important. °Contact a health care provider if: °· You cannot   flush your port with saline as directed, or you cannot draw blood from the port. °· You have a fever or chills. °· You have more redness, swelling, or pain around your port insertion site. °· You have more fluid or blood coming from your port insertion site. °· Your port insertion site feels warm to the touch. °· You have pus or a bad smell coming from the port insertion site. °Get help right away if: °· You have chest pain or shortness of breath. °· You have bleeding from your port that you cannot control. °Summary °· Take care of the port as told by your health care provider. °· Change your dressing as told by your health care provider. °· Keep all follow-up visits as told by your health care provider. °This information is not intended to replace advice given to you by your health care provider. Make sure you discuss any questions you have with your health care provider. °Document Released: 07/17/2013 Document Revised: 08/17/2016 Document Reviewed: 08/17/2016 °Elsevier Interactive Patient Education © 2017 [employer] Inc[employer] ° ° ° ° °Monitored Anesthesia Care, Care After °These instructions provide you with information about caring for yourself after  your procedure. Your health care provider may also give you more specific instructions. Your treatment has been planned according to current medical practices, but problems sometimes occur. Call your health care provider if you have any problems or questions after your procedure. °What can I expect after the procedure? °After your procedure, it is common to: °· Feel sleepy for several hours. °· Feel clumsy and have poor balance for several hours. °· Feel forgetful about what happened after the procedure. °· Have poor judgment for several hours. °· Feel nauseous or vomit. °· Have a sore throat if you had a breathing tube during the procedure. ° °Follow these instructions at home: °For at least 24 hours after the procedure: ° °· Do not: °? Participate in activities in which you could fall or become injured. °? Drive. °? Use heavy machinery. °? Drink alcohol. °? Take sleeping pills or medicines that cause drowsiness. °? Make important decisions or sign legal documents. °? Take care of children on your own. °· Rest. °Eating and drinking °· Follow the diet that is recommended by your health care provider. °· If you vomit, drink water, juice, or soup when you can drink without vomiting. °· Make sure you have little or no nausea before eating solid foods. °General instructions °· Have a responsible adult stay with you until you are awake and alert. °· Take over-the-counter and prescription medicines only as told by your health care provider. °· If you smoke, do not smoke without supervision. °· Keep all follow-up visits as told by your health care provider. This is important. °Contact a health care provider if: °· You keep feeling nauseous or you keep vomiting. °· You feel light-headed. °· You develop a rash. °· You have a fever. °Get help right away if: °· You have trouble breathing. °This information is not intended to replace advice given to you by your health care provider. Make sure you discuss any questions you have  with your health care provider. °Document Released: 01/17/2016 Document Revised: 05/18/2016 Document Reviewed: 01/17/2016 °Elsevier Interactive Patient Education © 2018 Elsevier Inc. ° °

## 2018-07-16 NOTE — Anesthesia Procedure Notes (Signed)
Procedure Name: MAC Date/Time: 07/16/2018 10:58 AM Performed by: Andree Elk Amy A, CRNA Pre-anesthesia Checklist: Patient identified, Timeout performed, Emergency Drugs available, Suction available and Patient being monitored Oxygen Delivery Method: Nasal cannula

## 2018-07-16 NOTE — Transfer of Care (Signed)
Immediate Anesthesia Transfer of Care Note  Patient: Zachary Dickerson.  Procedure(s) Performed: INSERTION PORT-A-CATH (Left Chest)  Patient Location: PACU  Anesthesia Type:MAC  Level of Consciousness: awake, alert , oriented and patient cooperative  Airway & Oxygen Therapy: Patient Spontanous Breathing  Post-op Assessment: Report given to RN and Post -op Vital signs reviewed and stable  Post vital signs: Reviewed and stable  Last Vitals:  Vitals Value Taken Time  BP 126/100 07/16/2018 11:34 AM  Temp    Pulse 83 07/16/2018 11:35 AM  Resp 12 07/16/2018 11:35 AM  SpO2 95 % 07/16/2018 11:35 AM  Vitals shown include unvalidated device data.  Last Pain:  Vitals:   07/16/18 1008  TempSrc: Oral  PainSc: 0-No pain      Patients Stated Pain Goal: 3 (45/80/99 8338)  Complications: No apparent anesthesia complications

## 2018-07-16 NOTE — Interval H&P Note (Signed)
History and Physical Interval Note:  07/16/2018 10:13 AM  Zachary Dickerson.  has presented today for surgery, with the diagnosis of nasopharyngeal carcinoma  The various methods of treatment have been discussed with the patient and family. After consideration of risks, benefits and other options for treatment, the patient has consented to  Procedure(s): INSERTION PORT-A-CATH (N/A) as a surgical intervention .  The patient's history has been reviewed, patient examined, no change in status, stable for surgery.  I have reviewed the patient's chart and labs.  Questions were answered to the patient's satisfaction.     Aviva Signs

## 2018-07-16 NOTE — Addendum Note (Signed)
Addendum  created 07/16/18 1144 by Lenice Llamas, MD   Order list changed, Order sets accessed

## 2018-07-16 NOTE — Anesthesia Postprocedure Evaluation (Signed)
Anesthesia Post Note  Patient: Zachary Dickerson.  Procedure(s) Performed: INSERTION PORT-A-CATH (Left Chest)  Patient location during evaluation: PACU Anesthesia Type: MAC Level of consciousness: awake and alert and oriented Pain management: pain level controlled Vital Signs Assessment: post-procedure vital signs reviewed and stable Respiratory status: spontaneous breathing Cardiovascular status: stable Postop Assessment: no apparent nausea or vomiting Anesthetic complications: no     Last Vitals:  Vitals:   07/16/18 1008  Pulse: 81  Resp: (!) 24  Temp: 36.8 C  SpO2: 95%    Last Pain:  Vitals:   07/16/18 1008  TempSrc: Oral  PainSc: 0-No pain                 Eloyce Bultman A

## 2018-07-17 ENCOUNTER — Other Ambulatory Visit (HOSPITAL_COMMUNITY): Payer: Self-pay | Admitting: Hematology

## 2018-07-18 ENCOUNTER — Other Ambulatory Visit (HOSPITAL_COMMUNITY): Payer: Self-pay

## 2018-07-18 ENCOUNTER — Encounter (HOSPITAL_COMMUNITY): Payer: Self-pay | Admitting: General Surgery

## 2018-07-18 DIAGNOSIS — C119 Malignant neoplasm of nasopharynx, unspecified: Secondary | ICD-10-CM

## 2018-07-19 ENCOUNTER — Encounter (HOSPITAL_COMMUNITY): Payer: Self-pay

## 2018-07-19 ENCOUNTER — Inpatient Hospital Stay (HOSPITAL_COMMUNITY): Payer: Medicare HMO

## 2018-07-19 ENCOUNTER — Other Ambulatory Visit: Payer: Self-pay

## 2018-07-19 VITALS — BP 122/52 | HR 64 | Temp 98.6°F | Resp 16 | Ht 69.0 in | Wt 210.0 lb

## 2018-07-19 DIAGNOSIS — F17210 Nicotine dependence, cigarettes, uncomplicated: Secondary | ICD-10-CM | POA: Diagnosis not present

## 2018-07-19 DIAGNOSIS — C770 Secondary and unspecified malignant neoplasm of lymph nodes of head, face and neck: Secondary | ICD-10-CM | POA: Diagnosis not present

## 2018-07-19 DIAGNOSIS — C119 Malignant neoplasm of nasopharynx, unspecified: Secondary | ICD-10-CM

## 2018-07-19 DIAGNOSIS — Z79899 Other long term (current) drug therapy: Secondary | ICD-10-CM | POA: Diagnosis not present

## 2018-07-19 DIAGNOSIS — Z5111 Encounter for antineoplastic chemotherapy: Secondary | ICD-10-CM | POA: Diagnosis not present

## 2018-07-19 DIAGNOSIS — J449 Chronic obstructive pulmonary disease, unspecified: Secondary | ICD-10-CM | POA: Diagnosis not present

## 2018-07-19 DIAGNOSIS — Z9081 Acquired absence of spleen: Secondary | ICD-10-CM | POA: Diagnosis not present

## 2018-07-19 DIAGNOSIS — F419 Anxiety disorder, unspecified: Secondary | ICD-10-CM | POA: Diagnosis not present

## 2018-07-19 DIAGNOSIS — R1310 Dysphagia, unspecified: Secondary | ICD-10-CM | POA: Diagnosis not present

## 2018-07-19 LAB — CBC WITH DIFFERENTIAL/PLATELET
Abs Immature Granulocytes: 0.05 10*3/uL (ref 0.00–0.07)
BASOS PCT: 1 %
Basophils Absolute: 0.1 10*3/uL (ref 0.0–0.1)
EOS ABS: 1.6 10*3/uL — AB (ref 0.0–0.5)
EOS PCT: 11 %
HEMATOCRIT: 43.7 % (ref 39.0–52.0)
Hemoglobin: 13.8 g/dL (ref 13.0–17.0)
Immature Granulocytes: 0 %
LYMPHS ABS: 3.7 10*3/uL (ref 0.7–4.0)
Lymphocytes Relative: 25 %
MCH: 31.2 pg (ref 26.0–34.0)
MCHC: 31.6 g/dL (ref 30.0–36.0)
MCV: 98.9 fL (ref 80.0–100.0)
MONO ABS: 1.5 10*3/uL — AB (ref 0.1–1.0)
MONOS PCT: 10 %
Neutro Abs: 8 10*3/uL — ABNORMAL HIGH (ref 1.7–7.7)
Neutrophils Relative %: 53 %
Platelets: 489 10*3/uL — ABNORMAL HIGH (ref 150–400)
RBC: 4.42 MIL/uL (ref 4.22–5.81)
RDW: 15.4 % (ref 11.5–15.5)
WBC: 15 10*3/uL — ABNORMAL HIGH (ref 4.0–10.5)
nRBC: 0 % (ref 0.0–0.2)

## 2018-07-19 LAB — COMPREHENSIVE METABOLIC PANEL
ALK PHOS: 113 U/L (ref 38–126)
ALT: 13 U/L (ref 0–44)
AST: 23 U/L (ref 15–41)
Albumin: 3.9 g/dL (ref 3.5–5.0)
Anion gap: 8 (ref 5–15)
BILIRUBIN TOTAL: 0.4 mg/dL (ref 0.3–1.2)
BUN: 16 mg/dL (ref 6–20)
CALCIUM: 9.1 mg/dL (ref 8.9–10.3)
CO2: 27 mmol/L (ref 22–32)
Chloride: 104 mmol/L (ref 98–111)
Creatinine, Ser: 0.94 mg/dL (ref 0.61–1.24)
GFR calc Af Amer: >60 mL/min (ref >60)
GLUCOSE: 107 mg/dL — AB (ref 70–99)
Potassium: 4.3 mmol/L (ref 3.5–5.1)
Sodium: 139 mmol/L (ref 135–145)
TOTAL PROTEIN: 8.2 g/dL — AB (ref 6.5–8.1)

## 2018-07-19 LAB — MAGNESIUM: Magnesium: 2.2 mg/dL (ref 1.7–2.4)

## 2018-07-19 MED ORDER — PALONOSETRON HCL INJECTION 0.25 MG/5ML
0.2500 mg | Freq: Once | INTRAVENOUS | Status: AC
  Administered 2018-07-19: 0.25 mg via INTRAVENOUS
  Filled 2018-07-19: qty 5

## 2018-07-19 MED ORDER — HEPARIN SOD (PORK) LOCK FLUSH 100 UNIT/ML IV SOLN
500.0000 [IU] | Freq: Once | INTRAVENOUS | Status: AC | PRN
  Administered 2018-07-19: 500 [IU]

## 2018-07-19 MED ORDER — SODIUM CHLORIDE 0.9 % IV SOLN
Freq: Once | INTRAVENOUS | Status: AC
  Administered 2018-07-19: 12:00:00 via INTRAVENOUS
  Filled 2018-07-19: qty 5

## 2018-07-19 MED ORDER — POTASSIUM CHLORIDE 2 MEQ/ML IV SOLN
Freq: Once | INTRAVENOUS | Status: AC
  Administered 2018-07-19: 09:00:00 via INTRAVENOUS
  Filled 2018-07-19: qty 10

## 2018-07-19 MED ORDER — SODIUM CHLORIDE 0.9 % IV SOLN
Freq: Once | INTRAVENOUS | Status: AC
  Administered 2018-07-19: 12:00:00 via INTRAVENOUS

## 2018-07-19 MED ORDER — POTASSIUM CHLORIDE 2 MEQ/ML IV SOLN
Freq: Once | INTRAVENOUS | Status: DC
  Filled 2018-07-19: qty 10

## 2018-07-19 MED ORDER — SODIUM CHLORIDE 0.9 % IV SOLN
INTRAVENOUS | Status: DC
  Administered 2018-07-19: 14:00:00 via INTRAVENOUS

## 2018-07-19 MED ORDER — SODIUM CHLORIDE 0.9 % IV SOLN
40.0000 mg/m2 | Freq: Once | INTRAVENOUS | Status: DC
  Filled 2018-07-19: qty 85

## 2018-07-19 MED ORDER — SODIUM CHLORIDE 0.9% FLUSH
10.0000 mL | INTRAVENOUS | Status: DC | PRN
  Administered 2018-07-19: 10 mL
  Filled 2018-07-19: qty 10

## 2018-07-19 NOTE — Patient Instructions (Signed)
[facility] Discharge Instructions for Patients Receiving Chemotherapy  Today you received the following chemotherapy agents   To help prevent nausea and vomiting after your treatment, we encourage you to take your nausea medication   If you develop nausea and vomiting that is not controlled by your nausea medication, call the clinic.   BELOW ARE SYMPTOMS THAT SHOULD BE REPORTED IMMEDIATELY:  *FEVER GREATER THAN 100.5 F  *CHILLS WITH OR WITHOUT FEVER  NAUSEA AND VOMITING THAT IS NOT CONTROLLED WITH YOUR NAUSEA MEDICATION  *UNUSUAL SHORTNESS OF BREATH  *UNUSUAL BRUISING OR BLEEDING  TENDERNESS IN MOUTH AND THROAT WITH OR WITHOUT PRESENCE OF ULCERS  *URINARY PROBLEMS  *BOWEL PROBLEMS  UNUSUAL RASH Items with * indicate a potential emergency and should be followed up as soon as possible.  Feel free to call the clinic should you have any questions or concerns. The clinic phone number is [phone].  Please show the CHEMO ALERT CARD at check-in to the Emergency Department and triage nurse.   

## 2018-07-19 NOTE — Progress Notes (Signed)
Pt presents ambulatory to room 408. Assisted with ambulation. Pt teaching performed for new Chemo Treatment (Cisplatin). 5FU pump discussed. Lab work reviewed. Proceed with treatment. Consent obtained.   Treatment given today per MD orders. Tolerated infusion without adverse affects. Vital signs stable. No complaints at this time. Discharged from clinic ambulatory. F/U with [facility] as scheduled.

## 2018-07-22 LAB — EPSTEIN BARR VRS(EBV DNA BY PCR)
EBV DNA QN BY PCR: 9493 {copies}/mL
LOG10 EBV DNA QN PCR: 3.977 {Log_copies}/mL

## 2018-07-23 ENCOUNTER — Ambulatory Visit (HOSPITAL_COMMUNITY): Payer: Medicare HMO

## 2018-07-23 ENCOUNTER — Telehealth (HOSPITAL_COMMUNITY): Payer: Self-pay

## 2018-07-23 ENCOUNTER — Ambulatory Visit (HOSPITAL_COMMUNITY): Payer: Medicare HMO | Admitting: Hematology

## 2018-07-23 NOTE — Telephone Encounter (Signed)
Pt's wife called concerning EMLA cream needing authorization so [facility] was called. Spoke with pharmacist and they will fax paperwork to [place] so Angie can send it in for authorization

## 2018-07-25 ENCOUNTER — Other Ambulatory Visit (HOSPITAL_COMMUNITY): Payer: Self-pay | Admitting: *Deleted

## 2018-07-25 DIAGNOSIS — C119 Malignant neoplasm of nasopharynx, unspecified: Secondary | ICD-10-CM

## 2018-07-26 ENCOUNTER — Other Ambulatory Visit (HOSPITAL_COMMUNITY): Payer: Medicare HMO

## 2018-07-26 ENCOUNTER — Encounter (HOSPITAL_COMMUNITY): Payer: Self-pay

## 2018-07-26 ENCOUNTER — Inpatient Hospital Stay (HOSPITAL_COMMUNITY): Payer: Medicare HMO

## 2018-07-26 ENCOUNTER — Ambulatory Visit (HOSPITAL_COMMUNITY): Payer: Medicare HMO

## 2018-07-26 ENCOUNTER — Inpatient Hospital Stay (HOSPITAL_BASED_OUTPATIENT_CLINIC_OR_DEPARTMENT_OTHER): Payer: Medicare HMO | Admitting: Internal Medicine

## 2018-07-26 VITALS — BP 130/63 | HR 73 | Temp 97.7°F | Resp 18 | Wt 201.2 lb

## 2018-07-26 DIAGNOSIS — J449 Chronic obstructive pulmonary disease, unspecified: Secondary | ICD-10-CM | POA: Diagnosis not present

## 2018-07-26 DIAGNOSIS — F17210 Nicotine dependence, cigarettes, uncomplicated: Secondary | ICD-10-CM | POA: Diagnosis not present

## 2018-07-26 DIAGNOSIS — C119 Malignant neoplasm of nasopharynx, unspecified: Secondary | ICD-10-CM | POA: Diagnosis not present

## 2018-07-26 DIAGNOSIS — Z5111 Encounter for antineoplastic chemotherapy: Secondary | ICD-10-CM | POA: Diagnosis not present

## 2018-07-26 LAB — COMPREHENSIVE METABOLIC PANEL
ALT: 21 U/L (ref 0–44)
ANION GAP: 8 (ref 5–15)
AST: 21 U/L (ref 15–41)
Albumin: 3.8 g/dL (ref 3.5–5.0)
Alkaline Phosphatase: 105 U/L (ref 38–126)
BILIRUBIN TOTAL: 0.5 mg/dL (ref 0.3–1.2)
BUN: 23 mg/dL — AB (ref 6–20)
CALCIUM: 9.5 mg/dL (ref 8.9–10.3)
CO2: 28 mmol/L (ref 22–32)
Chloride: 103 mmol/L (ref 98–111)
Creatinine, Ser: 0.92 mg/dL (ref 0.61–1.24)
GLUCOSE: 138 mg/dL — AB (ref 70–99)
Potassium: 4.5 mmol/L (ref 3.5–5.1)
Sodium: 139 mmol/L (ref 135–145)
TOTAL PROTEIN: 8.2 g/dL — AB (ref 6.5–8.1)

## 2018-07-26 LAB — CBC WITH DIFFERENTIAL/PLATELET
Abs Immature Granulocytes: 0.1 10*3/uL — ABNORMAL HIGH (ref 0.00–0.07)
BASOS PCT: 0 %
Basophils Absolute: 0 10*3/uL (ref 0.0–0.1)
EOS ABS: 0 10*3/uL (ref 0.0–0.5)
Eosinophils Relative: 0 %
HCT: 44.5 % (ref 39.0–52.0)
Hemoglobin: 14.1 g/dL (ref 13.0–17.0)
Immature Granulocytes: 1 %
Lymphocytes Relative: 12 %
Lymphs Abs: 1.7 10*3/uL (ref 0.7–4.0)
MCH: 30.4 pg (ref 26.0–34.0)
MCHC: 31.7 g/dL (ref 30.0–36.0)
MCV: 95.9 fL (ref 80.0–100.0)
MONO ABS: 1.1 10*3/uL — AB (ref 0.1–1.0)
Monocytes Relative: 8 %
NEUTROS PCT: 79 %
Neutro Abs: 11.4 10*3/uL — ABNORMAL HIGH (ref 1.7–7.7)
PLATELETS: 465 10*3/uL — AB (ref 150–400)
RBC: 4.64 MIL/uL (ref 4.22–5.81)
RDW: 14.9 % (ref 11.5–15.5)
WBC: 14.3 10*3/uL — AB (ref 4.0–10.5)
nRBC: 0 % (ref 0.0–0.2)

## 2018-07-26 LAB — MAGNESIUM: Magnesium: 1.9 mg/dL (ref 1.7–2.4)

## 2018-07-26 MED ORDER — MAGIC MOUTHWASH W/LIDOCAINE: 5.0000 mL | Freq: Four times a day (QID) | ORAL | 0 refills | Status: DC | PRN

## 2018-07-26 MED ORDER — SODIUM CHLORIDE 0.9 % IV SOLN
Freq: Once | INTRAVENOUS | Status: AC
  Administered 2018-07-26: 12:00:00 via INTRAVENOUS

## 2018-07-26 MED ORDER — SODIUM CHLORIDE 0.9 % IV SOLN
Freq: Once | INTRAVENOUS | Status: AC
  Administered 2018-07-26: 12:00:00 via INTRAVENOUS
  Filled 2018-07-26: qty 5

## 2018-07-26 MED ORDER — SODIUM CHLORIDE 0.9 % IV SOLN
Freq: Once | INTRAVENOUS | Status: AC
  Administered 2018-07-26: 14:00:00 via INTRAVENOUS

## 2018-07-26 MED ORDER — HEPARIN SOD (PORK) LOCK FLUSH 100 UNIT/ML IV SOLN
500.0000 [IU] | Freq: Once | INTRAVENOUS | Status: AC | PRN
  Administered 2018-07-26: 500 [IU]

## 2018-07-26 MED ORDER — SODIUM CHLORIDE 0.9 % IV SOLN
40.0000 mg/m2 | Freq: Once | INTRAVENOUS | Status: AC
  Administered 2018-07-26: 85 mg via INTRAVENOUS
  Filled 2018-07-26: qty 85

## 2018-07-26 MED ORDER — POTASSIUM CHLORIDE 2 MEQ/ML IV SOLN
Freq: Once | INTRAVENOUS | Status: AC
  Administered 2018-07-26: 10:00:00 via INTRAVENOUS
  Filled 2018-07-26: qty 10

## 2018-07-26 MED ORDER — PALONOSETRON HCL INJECTION 0.25 MG/5ML
0.2500 mg | Freq: Once | INTRAVENOUS | Status: AC
  Administered 2018-07-26: 0.25 mg via INTRAVENOUS
  Filled 2018-07-26: qty 5

## 2018-07-26 MED ORDER — SODIUM CHLORIDE 0.9% FLUSH
10.0000 mL | INTRAVENOUS | Status: DC | PRN
  Administered 2018-07-26: 10 mL
  Filled 2018-07-26: qty 10

## 2018-07-26 NOTE — Patient Instructions (Signed)
[facility] Discharge Instructions for Patients Receiving Chemotherapy   Beginning January 23rd 2017 lab work for the Cancer Center will be done in the  Main lab at [facility] on 1st floor. If you have a lab appointment with the Cancer Center please come in thru the  Main Entrance and check in at the main information desk   Today you received the following chemotherapy agents Cisplatin.Follow-up as scheduled. Call clinic for any questions or concerns  To help prevent nausea and vomiting after your treatment, we encourage you to take your nausea medication   If you develop nausea and vomiting, or diarrhea that is not controlled by your medication, call the clinic.  The clinic phone number is [phone]. Office hours are Monday-Friday 8:30am-5:00pm.  BELOW ARE SYMPTOMS THAT SHOULD BE REPORTED IMMEDIATELY:  *FEVER GREATER THAN 101.0 F  *CHILLS WITH OR WITHOUT FEVER  NAUSEA AND VOMITING THAT IS NOT CONTROLLED WITH YOUR NAUSEA MEDICATION  *UNUSUAL SHORTNESS OF BREATH  *UNUSUAL BRUISING OR BLEEDING  TENDERNESS IN MOUTH AND THROAT WITH OR WITHOUT PRESENCE OF ULCERS  *URINARY PROBLEMS  *BOWEL PROBLEMS  UNUSUAL RASH Items with * indicate a potential emergency and should be followed up as soon as possible. If you have an emergency after office hours please contact your primary care physician or go to the nearest emergency department.  Please call the clinic during office hours if you have any questions or concerns.   You may also contact the Patient Navigator at [phone] should you have any questions or need assistance in obtaining follow up care.      Resources For Cancer Patients and their Caregivers ? American Cancer Society: Can assist with transportation, wigs, general needs, runs Look Good Feel Better.        1-888-227-6333 ? Cancer Care: Provides financial assistance, online support groups, medication/co-pay assistance.  1-800-813-HOPE  (4673) ? [facility] Assists [place] cancer patients and their families through emotional , educational and financial support.  336-427-4357 ? [place] DSS Where to apply for food stamps, Medicaid and utility assistance. 336-342-1394 ? [facility]: Transportation to medical appointments. 336-347-2287 ? Social Security Administration: May apply for disability if have a Stage IV cancer. 336-342-7796 1-800-772-1213 ? [place] Aging, Disability and Transit Services: Assists with nutrition, care and transit needs. 336-349-2343         

## 2018-07-26 NOTE — Progress Notes (Signed)
0910 Labs reviewed with and pt seen by Dr. Walden Field today and pt approved for chemo tx per MD. Magic mouthwash with Lidocaine also e-scribed to pt's pharmacy per Dr. Walden Field orders.    Zachary Dickerson. tolerated Cisplatin infusion with hydration well without complaints or incident. VSS upon discharge. Pt discharged self ambulatory in satisfactory condition accompanied by family member
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
## 2018-08-02 ENCOUNTER — Inpatient Hospital Stay (HOSPITAL_COMMUNITY): Payer: Medicare HMO

## 2018-08-02 ENCOUNTER — Other Ambulatory Visit (HOSPITAL_COMMUNITY): Payer: Medicare HMO

## 2018-08-02 ENCOUNTER — Encounter (HOSPITAL_COMMUNITY): Payer: Self-pay | Admitting: *Deleted

## 2018-08-02 ENCOUNTER — Ambulatory Visit (HOSPITAL_COMMUNITY): Payer: Medicare HMO

## 2018-08-02 ENCOUNTER — Ambulatory Visit (HOSPITAL_COMMUNITY): Payer: Medicare HMO | Admitting: Hematology

## 2018-08-02 NOTE — Progress Notes (Signed)
I was contacted by Barnett Applebaum from radiation at [facility].  She states that the patient has not shown up for radiation at all this week.  She was able to talk with patient on Monday after he no showed and he said he was too sick and wasn't going to come in.  He didn't show Tuesday or Wednesday and then she talked with his wife Edwena Blow on Wednesday and she stated that he wasn't at home at the moment but she would get him to return their call. He never returned their call and then didn't show up for treatment again today.   Edwena Blow returned my call and voiced their concern about [place]'s symptoms.  She states that he is not doing good at all. He has a metallic taste in his mouth and his spit tastes horrible. He is not able to eat due to his lips burning and his tongue being cracked.  She states that he hasn't been going to radiation due to him not feeling well.  She said his concern is if "I feel this bad with week 2, I would hate to see how I feel at week 5 or 6" she said that he is not sure what he wants to do moving forward. She doesn't know if he wants treatment or not.    I advised her that these are side effects of treatment and are manageable.  I offered to have them come in for fluids and lab work to see how he is doing. She states that she is at her grandchildren's home and he is not with her at the moment. When she gets home she will talk with him and see what he wants to do. If he wants to come in for labs and fluids she will call me back.   I asked that she call me back either way because I need to hear from him what he wants to do.  I asked if she was going to take him to radiation tomorrow and she states " he is not going tomorrow either".  I asked that she please call me back or have him call me back when she gets home so that we can help manage his symptoms.    I talked with Dr. Delton Coombes about this and he said if patient does not want to come in we can at least call in magic mouth wash to help  with his mouth so that he can attempt to [place].  I will attempt to call patient again tomorrow to inquire about his needs.   I called [place], RN at [facility] and gave her report and she wants patient to have conversation with Dr. Raliegh Ip prior to him returning to radiation in [place].  I told her I would report to her once we see him in office.

## 2018-08-08 ENCOUNTER — Encounter (HOSPITAL_COMMUNITY): Payer: Self-pay | Admitting: *Deleted

## 2018-08-09 ENCOUNTER — Other Ambulatory Visit: Payer: Self-pay

## 2018-08-09 ENCOUNTER — Encounter (HOSPITAL_COMMUNITY): Payer: Self-pay | Admitting: Hematology

## 2018-08-09 ENCOUNTER — Other Ambulatory Visit (HOSPITAL_COMMUNITY): Payer: Medicare HMO

## 2018-08-09 ENCOUNTER — Inpatient Hospital Stay (HOSPITAL_BASED_OUTPATIENT_CLINIC_OR_DEPARTMENT_OTHER): Payer: Medicare HMO | Admitting: Hematology

## 2018-08-09 ENCOUNTER — Other Ambulatory Visit (HOSPITAL_COMMUNITY): Payer: Self-pay | Admitting: Nurse Practitioner

## 2018-08-09 ENCOUNTER — Inpatient Hospital Stay (HOSPITAL_COMMUNITY): Payer: Medicare HMO

## 2018-08-09 VITALS — BP 114/82 | HR 98 | Temp 98.0°F | Resp 20 | Wt 195.2 lb

## 2018-08-09 DIAGNOSIS — C119 Malignant neoplasm of nasopharynx, unspecified: Secondary | ICD-10-CM

## 2018-08-09 DIAGNOSIS — F17210 Nicotine dependence, cigarettes, uncomplicated: Secondary | ICD-10-CM | POA: Diagnosis not present

## 2018-08-09 DIAGNOSIS — J449 Chronic obstructive pulmonary disease, unspecified: Secondary | ICD-10-CM | POA: Diagnosis not present

## 2018-08-09 DIAGNOSIS — Z5111 Encounter for antineoplastic chemotherapy: Secondary | ICD-10-CM | POA: Diagnosis not present

## 2018-08-09 LAB — COMPREHENSIVE METABOLIC PANEL
ALBUMIN: 3.5 g/dL (ref 3.5–5.0)
ALT: 18 U/L (ref 0–44)
AST: 22 U/L (ref 15–41)
Alkaline Phosphatase: 119 U/L (ref 38–126)
Anion gap: 9 (ref 5–15)
BILIRUBIN TOTAL: 0.5 mg/dL (ref 0.3–1.2)
BUN: 13 mg/dL (ref 6–20)
CO2: 23 mmol/L (ref 22–32)
Calcium: 9.2 mg/dL (ref 8.9–10.3)
Chloride: 106 mmol/L (ref 98–111)
Creatinine, Ser: 0.97 mg/dL (ref 0.61–1.24)
GFR calc Af Amer: >60 mL/min (ref >60)
GFR calc non Af Amer: >60 mL/min (ref >60)
GLUCOSE: 109 mg/dL — AB (ref 70–99)
POTASSIUM: 4.3 mmol/L (ref 3.5–5.1)
SODIUM: 138 mmol/L (ref 135–145)
TOTAL PROTEIN: 8 g/dL (ref 6.5–8.1)

## 2018-08-09 LAB — CBC WITH DIFFERENTIAL/PLATELET
Abs Immature Granulocytes: 0.01 10*3/uL (ref 0.00–0.07)
BASOS ABS: 0 10*3/uL (ref 0.0–0.1)
Basophils Relative: 1 %
EOS ABS: 0.3 10*3/uL (ref 0.0–0.5)
EOS PCT: 5 %
HEMATOCRIT: 41.4 % (ref 39.0–52.0)
Hemoglobin: 13.5 g/dL (ref 13.0–17.0)
Immature Granulocytes: 0 %
Lymphocytes Relative: 17 %
Lymphs Abs: 1.2 10*3/uL (ref 0.7–4.0)
MCH: 31.1 pg (ref 26.0–34.0)
MCHC: 32.6 g/dL (ref 30.0–36.0)
MCV: 95.4 fL (ref 80.0–100.0)
Monocytes Absolute: 0.8 10*3/uL (ref 0.1–1.0)
Monocytes Relative: 12 %
NRBC: 0 % (ref 0.0–0.2)
Neutro Abs: 4.6 10*3/uL (ref 1.7–7.7)
Neutrophils Relative %: 65 %
Platelets: 538 10*3/uL — ABNORMAL HIGH (ref 150–400)
RBC: 4.34 MIL/uL (ref 4.22–5.81)
RDW: 14.5 % (ref 11.5–15.5)
WBC: 7 10*3/uL (ref 4.0–10.5)

## 2018-08-09 LAB — MAGNESIUM: Magnesium: 1.9 mg/dL (ref 1.7–2.4)

## 2018-08-09 LAB — LACTATE DEHYDROGENASE: LDH: 148 U/L (ref 98–192)
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
## 2018-08-13 ENCOUNTER — Encounter (HOSPITAL_COMMUNITY): Payer: Self-pay | Admitting: *Deleted

## 2018-08-13 NOTE — Progress Notes (Signed)
I received a call from [facility] today and they state that patient did not show up for his appointment for radiation today.  They were able to contact his girlfriend and she said that patient does not want to do any further treatments with them and so he wasn't coming.    I attempted to call patient to speak with him about this and see what he wanted to do from our perspective. I had to leave a message. I asked that he return our call and let us know what he wants to do moving forward. I left my phone number as well as the cancer center's main number.

## 2018-08-14 ENCOUNTER — Ambulatory Visit (HOSPITAL_COMMUNITY): Payer: Medicare HMO

## 2018-08-15 ENCOUNTER — Ambulatory Visit (HOSPITAL_COMMUNITY): Payer: Medicare HMO

## 2018-08-16 ENCOUNTER — Other Ambulatory Visit (HOSPITAL_COMMUNITY): Payer: Medicare HMO

## 2018-08-16 ENCOUNTER — Ambulatory Visit (HOSPITAL_COMMUNITY): Payer: Medicare HMO

## 2018-08-21 ENCOUNTER — Ambulatory Visit (HOSPITAL_COMMUNITY): Payer: Medicare HMO | Admitting: Hematology

## 2018-08-21 ENCOUNTER — Other Ambulatory Visit (HOSPITAL_COMMUNITY): Payer: Medicare HMO

## 2018-08-21 ENCOUNTER — Ambulatory Visit (HOSPITAL_COMMUNITY): Payer: Medicare HMO

## 2018-08-22 ENCOUNTER — Ambulatory Visit (HOSPITAL_COMMUNITY): Payer: Medicare HMO

## 2018-08-23 ENCOUNTER — Other Ambulatory Visit (HOSPITAL_COMMUNITY): Payer: Medicare HMO

## 2018-08-23 ENCOUNTER — Ambulatory Visit (HOSPITAL_COMMUNITY): Payer: Medicare HMO

## 2018-08-29 ENCOUNTER — Other Ambulatory Visit (HOSPITAL_COMMUNITY): Payer: Medicare HMO

## 2018-08-29 ENCOUNTER — Ambulatory Visit (HOSPITAL_COMMUNITY): Payer: Medicare HMO | Admitting: Hematology

## 2018-08-30 ENCOUNTER — Ambulatory Visit (HOSPITAL_COMMUNITY): Payer: Medicare HMO | Admitting: Hematology

## 2018-09-24 ENCOUNTER — Ambulatory Visit (HOSPITAL_COMMUNITY): Payer: Medicare HMO | Admitting: Hematology

## 2018-09-24 ENCOUNTER — Other Ambulatory Visit (HOSPITAL_COMMUNITY): Payer: Medicare HMO

## 2018-10-12 ENCOUNTER — Other Ambulatory Visit (HOSPITAL_COMMUNITY): Payer: Self-pay | Admitting: Radiation Oncology

## 2018-10-12 ENCOUNTER — Other Ambulatory Visit: Payer: Self-pay | Admitting: Radiation Oncology

## 2018-10-12 DIAGNOSIS — C118 Malignant neoplasm of overlapping sites of nasopharynx: Secondary | ICD-10-CM

## 2018-10-19 ENCOUNTER — Encounter (HOSPITAL_COMMUNITY): Payer: Self-pay

## 2018-10-19 ENCOUNTER — Encounter (HOSPITAL_COMMUNITY): Payer: Medicare Other

## 2018-10-25 ENCOUNTER — Encounter (HOSPITAL_COMMUNITY): Payer: Self-pay | Admitting: *Deleted

## 2018-10-29 ENCOUNTER — Other Ambulatory Visit (HOSPITAL_COMMUNITY): Payer: Self-pay | Admitting: *Deleted

## 2018-11-07 ENCOUNTER — Encounter (HOSPITAL_COMMUNITY)
Admission: RE | Admit: 2018-11-07 | Discharge: 2018-11-07 | Disposition: A | Discharge status: Home / Self Care | Payer: Medicare Other | Source: Ambulatory Visit | Attending: Radiation Oncology | Admitting: Radiation Oncology

## 2018-11-07 DIAGNOSIS — C118 Malignant neoplasm of overlapping sites of nasopharynx: Secondary | ICD-10-CM | POA: Insufficient documentation

## 2018-11-08 ENCOUNTER — Ambulatory Visit (HOSPITAL_COMMUNITY)
Admission: RE | Admit: 2018-11-08 | Discharge: 2018-11-08 | Disposition: A | Discharge status: Home / Self Care | Payer: Medicare Other | Source: Ambulatory Visit | Attending: Radiation Oncology | Admitting: Radiation Oncology

## 2018-11-08 DIAGNOSIS — C118 Malignant neoplasm of overlapping sites of nasopharynx: Secondary | ICD-10-CM | POA: Diagnosis present

## 2018-11-08 LAB — GLUCOSE, CAPILLARY: Glucose-Capillary: 96 mg/dL (ref 70–99)

## 2018-11-08 MED ORDER — FLUDEOXYGLUCOSE F - 18 (FDG) INJECTION: 10.5000 | Freq: Once | INTRAVENOUS | Status: DC | PRN

## 2018-11-09 ENCOUNTER — Encounter (HOSPITAL_COMMUNITY): Payer: Self-pay | Admitting: *Deleted

## 2018-11-09 NOTE — Progress Notes (Signed)
I received a call from [facility] in [place]. Dr. Frances Maywood has ordered a PET scan for patient and he has had some progression in his disease. Dr. Frances Maywood wants [place] to get patient back in for consult.  I have called patient and left a voicemail for him to return call and schedule appointment.

## 2018-11-12 ENCOUNTER — Other Ambulatory Visit: Payer: Self-pay

## 2018-11-12 ENCOUNTER — Encounter (HOSPITAL_COMMUNITY): Payer: Self-pay | Admitting: Hematology

## 2018-11-12 ENCOUNTER — Inpatient Hospital Stay (HOSPITAL_COMMUNITY): Payer: Medicare Other | Attending: Hematology | Admitting: Hematology

## 2018-11-12 DIAGNOSIS — Z79899 Other long term (current) drug therapy: Secondary | ICD-10-CM | POA: Diagnosis not present

## 2018-11-12 DIAGNOSIS — C119 Malignant neoplasm of nasopharynx, unspecified: Secondary | ICD-10-CM | POA: Diagnosis not present

## 2018-11-12 DIAGNOSIS — Z5111 Encounter for antineoplastic chemotherapy: Secondary | ICD-10-CM | POA: Diagnosis present

## 2018-11-12 DIAGNOSIS — F17210 Nicotine dependence, cigarettes, uncomplicated: Secondary | ICD-10-CM | POA: Insufficient documentation

## 2018-11-12 DIAGNOSIS — J449 Chronic obstructive pulmonary disease, unspecified: Secondary | ICD-10-CM | POA: Insufficient documentation

## 2018-11-12 DIAGNOSIS — Z9081 Acquired absence of spleen: Secondary | ICD-10-CM | POA: Insufficient documentation

## 2018-11-12 DIAGNOSIS — N189 Chronic kidney disease, unspecified: Secondary | ICD-10-CM | POA: Diagnosis not present

## 2018-11-12 DIAGNOSIS — C770 Secondary and unspecified malignant neoplasm of lymph nodes of head, face and neck: Secondary | ICD-10-CM | POA: Insufficient documentation

## 2018-11-12 DIAGNOSIS — R110 Nausea: Secondary | ICD-10-CM | POA: Diagnosis not present

## 2018-11-12 NOTE — Patient Instructions (Signed)
[facility] at [facility] Discharge Instructions     Thank you for choosing [facility] at [facility] to provide your oncology and hematology care.  To afford each patient quality time with our provider, please arrive at least 15 minutes before your scheduled appointment time.   If you have a lab appointment with the Cancer Center please come in thru the  Main Entrance and check in at the main information desk  You need to re-schedule your appointment should you arrive 10 or more minutes late.  We strive to give you quality time with our providers, and arriving late affects you and other patients whose appointments are after yours.  Also, if you no show three or more times for appointments you may be dismissed from the clinic at the providers discretion.     Again, thank you for choosing [facility].  Our hope is that these requests will decrease the amount of time that you wait before being seen by our physicians.       _____________________________________________________________  Should you have questions after your visit to [facility], please contact our office at [phone] between the hours of 8:00 a.m. and 4:30 p.m.  Voicemails left after 4:00 p.m. will not be returned until the following business day.  For prescription refill requests, have your pharmacy contact our office and allow 72 hours.    Cancer Center Support Programs:   > Cancer Support Group  2nd Tuesday of the month 1pm-2pm, Journey Room    
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
## 2018-11-15 ENCOUNTER — Ambulatory Visit: Payer: Self-pay | Admitting: General Surgery

## 2018-11-20 ENCOUNTER — Ambulatory Visit (INDEPENDENT_AMBULATORY_CARE_PROVIDER_SITE_OTHER): Payer: Medicare Other | Admitting: General Surgery

## 2018-11-20 ENCOUNTER — Encounter: Payer: Self-pay | Admitting: General Surgery

## 2018-11-20 VITALS — BP 129/74 | HR 98 | Temp 97.5°F | Resp 20 | Wt 189.2 lb

## 2018-11-20 DIAGNOSIS — C119 Malignant neoplasm of nasopharynx, unspecified: Secondary | ICD-10-CM | POA: Diagnosis not present
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
## 2018-11-21 ENCOUNTER — Encounter: Payer: Self-pay | Admitting: General Surgery
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
## 2018-11-23 ENCOUNTER — Other Ambulatory Visit (HOSPITAL_COMMUNITY): Payer: Self-pay

## 2018-11-23 DIAGNOSIS — C119 Malignant neoplasm of nasopharynx, unspecified: Secondary | ICD-10-CM

## 2018-11-26 ENCOUNTER — Inpatient Hospital Stay (HOSPITAL_COMMUNITY): Payer: Medicare Other

## 2018-11-26 ENCOUNTER — Encounter (HOSPITAL_COMMUNITY): Payer: Self-pay

## 2018-11-26 ENCOUNTER — Other Ambulatory Visit: Payer: Self-pay

## 2018-11-26 VITALS — BP 124/78 | HR 92 | Temp 97.5°F | Resp 18

## 2018-11-26 DIAGNOSIS — C119 Malignant neoplasm of nasopharynx, unspecified: Secondary | ICD-10-CM

## 2018-11-26 DIAGNOSIS — Z5111 Encounter for antineoplastic chemotherapy: Secondary | ICD-10-CM | POA: Diagnosis not present

## 2018-11-26 LAB — COMPREHENSIVE METABOLIC PANEL
ALBUMIN: 3.9 g/dL (ref 3.5–5.0)
ALT: <5 U/L (ref 0–44)
AST: 16 U/L (ref 15–41)
Alkaline Phosphatase: 98 U/L (ref 38–126)
Anion gap: 10 (ref 5–15)
BUN: 24 mg/dL — AB (ref 6–20)
CO2: 23 mmol/L (ref 22–32)
CREATININE: 1.29 mg/dL — AB (ref 0.61–1.24)
Calcium: 9.6 mg/dL (ref 8.9–10.3)
Chloride: 106 mmol/L (ref 98–111)
GFR calc Af Amer: >60 mL/min (ref >60)
GFR calc non Af Amer: >60 mL/min (ref >60)
Glucose, Bld: 115 mg/dL — ABNORMAL HIGH (ref 70–99)
Potassium: 4.1 mmol/L (ref 3.5–5.1)
SODIUM: 139 mmol/L (ref 135–145)
Total Bilirubin: 0.2 mg/dL — ABNORMAL LOW (ref 0.3–1.2)
Total Protein: 8.4 g/dL — ABNORMAL HIGH (ref 6.5–8.1)

## 2018-11-26 LAB — CBC WITH DIFFERENTIAL/PLATELET
Abs Immature Granulocytes: 0.04 10*3/uL (ref 0.00–0.07)
Basophils Absolute: 0.1 10*3/uL (ref 0.0–0.1)
Basophils Relative: 1 %
Eosinophils Absolute: 0.1 10*3/uL (ref 0.0–0.5)
Eosinophils Relative: 1 %
HCT: 44.5 % (ref 39.0–52.0)
Hemoglobin: 14.3 g/dL (ref 13.0–17.0)
Immature Granulocytes: 0 %
Lymphocytes Relative: 19 %
Lymphs Abs: 2.1 10*3/uL (ref 0.7–4.0)
MCH: 31.4 pg (ref 26.0–34.0)
MCHC: 32.1 g/dL (ref 30.0–36.0)
MCV: 97.6 fL (ref 80.0–100.0)
Monocytes Absolute: 0.9 10*3/uL (ref 0.1–1.0)
Monocytes Relative: 8 %
NEUTROS PCT: 71 %
Neutro Abs: 7.7 10*3/uL (ref 1.7–7.7)
Platelets: 527 10*3/uL — ABNORMAL HIGH (ref 150–400)
RBC: 4.56 MIL/uL (ref 4.22–5.81)
RDW: 15.1 % (ref 11.5–15.5)
WBC: 10.9 10*3/uL — ABNORMAL HIGH (ref 4.0–10.5)
nRBC: 0 % (ref 0.0–0.2)

## 2018-11-26 LAB — MAGNESIUM: Magnesium: 1.8 mg/dL (ref 1.7–2.4)

## 2018-11-26 MED ORDER — PALONOSETRON HCL INJECTION 0.25 MG/5ML
0.2500 mg | Freq: Once | INTRAVENOUS | Status: AC
  Administered 2018-11-26: 0.25 mg via INTRAVENOUS
  Filled 2018-11-26: qty 5

## 2018-11-26 MED ORDER — SODIUM CHLORIDE 0.9 % IV SOLN
Freq: Once | INTRAVENOUS | Status: AC
  Administered 2018-11-26: 16:00:00 via INTRAVENOUS

## 2018-11-26 MED ORDER — SODIUM CHLORIDE 0.9% FLUSH
10.0000 mL | INTRAVENOUS | Status: DC | PRN
  Administered 2018-11-26: 10 mL
  Filled 2018-11-26: qty 10

## 2018-11-26 MED ORDER — SODIUM CHLORIDE 0.9 % IV SOLN
Freq: Once | INTRAVENOUS | Status: AC
  Administered 2018-11-26: 11:00:00 via INTRAVENOUS

## 2018-11-26 MED ORDER — POTASSIUM CHLORIDE 2 MEQ/ML IV SOLN
Freq: Once | INTRAVENOUS | Status: AC
  Administered 2018-11-26: 11:00:00 via INTRAVENOUS
  Filled 2018-11-26: qty 10

## 2018-11-26 MED ORDER — HEPARIN SOD (PORK) LOCK FLUSH 100 UNIT/ML IV SOLN: 500.0000 [IU] | Freq: Once | INTRAVENOUS | Status: DC

## 2018-11-26 MED ORDER — SODIUM CHLORIDE 0.9 % IV SOLN
Freq: Once | INTRAVENOUS | Status: AC
  Administered 2018-11-26: 14:00:00 via INTRAVENOUS
  Filled 2018-11-26: qty 5

## 2018-11-26 MED ORDER — HEPARIN SOD (PORK) LOCK FLUSH 100 UNIT/ML IV SOLN
INTRAVENOUS | Status: AC
  Filled 2018-11-26: qty 5

## 2018-11-26 MED ORDER — SODIUM CHLORIDE 0.9 % IV SOLN
40.0000 mg/m2 | Freq: Once | INTRAVENOUS | Status: AC
  Administered 2018-11-26: 85 mg via INTRAVENOUS
  Filled 2018-11-26: qty 85

## 2018-11-26 NOTE — Patient Instructions (Signed)
[facility] Discharge Instructions for Patients Receiving Chemotherapy  Today you received the following chemotherapy agents   To help prevent nausea and vomiting after your treatment, we encourage you to take your nausea medication   If you develop nausea and vomiting that is not controlled by your nausea medication, call the clinic.   BELOW ARE SYMPTOMS THAT SHOULD BE REPORTED IMMEDIATELY:  *FEVER GREATER THAN 100.5 F  *CHILLS WITH OR WITHOUT FEVER  NAUSEA AND VOMITING THAT IS NOT CONTROLLED WITH YOUR NAUSEA MEDICATION  *UNUSUAL SHORTNESS OF BREATH  *UNUSUAL BRUISING OR BLEEDING  TENDERNESS IN MOUTH AND THROAT WITH OR WITHOUT PRESENCE OF ULCERS  *URINARY PROBLEMS  *BOWEL PROBLEMS  UNUSUAL RASH Items with * indicate a potential emergency and should be followed up as soon as possible.  Feel free to call the clinic should you have any questions or concerns. The clinic phone number is [phone].  Please show the CHEMO ALERT CARD at check-in to the Emergency Department and triage nurse.   

## 2018-11-26 NOTE — Progress Notes (Signed)
Pt presents today for treatment. VSS. No complaints of any changes since the last visit. No pain noted today. Pt given a copy of appt schedule for f/u until March 25. Family and pt. verbalized an understanding.   Per RLockamy NP. Proceed with treatment. Reported BUN of 24 prior to treatment.   Treatment given today per MD orders. Tolerated infusion without adverse affects. Vital signs stable. No complaints at this time. Discharged from clinic ambulatory. F/U with [facility] as scheduled.

## 2018-11-27 ENCOUNTER — Inpatient Hospital Stay (HOSPITAL_COMMUNITY): Payer: Medicare Other

## 2018-11-27 ENCOUNTER — Other Ambulatory Visit (HOSPITAL_COMMUNITY): Payer: Self-pay | Admitting: Nurse Practitioner

## 2018-11-27 VITALS — BP 120/83 | HR 95 | Temp 97.8°F | Resp 18

## 2018-11-27 DIAGNOSIS — C119 Malignant neoplasm of nasopharynx, unspecified: Secondary | ICD-10-CM

## 2018-11-27 DIAGNOSIS — Z5111 Encounter for antineoplastic chemotherapy: Secondary | ICD-10-CM | POA: Diagnosis not present

## 2018-11-27 MED ORDER — SODIUM CHLORIDE 0.9% FLUSH
10.0000 mL | Freq: Once | INTRAVENOUS | Status: AC
  Administered 2018-11-27: 10 mL

## 2018-11-27 MED ORDER — SODIUM CHLORIDE 0.9 % IV SOLN
Freq: Once | INTRAVENOUS | Status: DC
  Filled 2018-11-27: qty 1000

## 2018-11-27 MED ORDER — HEPARIN SOD (PORK) LOCK FLUSH 100 UNIT/ML IV SOLN
500.0000 [IU] | Freq: Once | INTRAVENOUS | Status: AC
  Administered 2018-11-27: 500 [IU] via INTRAVENOUS

## 2018-11-27 MED ORDER — SODIUM CHLORIDE 0.9 % IV SOLN
Freq: Once | INTRAVENOUS | Status: AC
  Administered 2018-11-27: 09:00:00 via INTRAVENOUS
  Filled 2018-11-27: qty 1000

## 2018-11-27 NOTE — Progress Notes (Signed)
Pt presents today for IV fluids only. VSS. Pt has no complaints of any pain or changes since last visit. MAR reviewed.   IV fluids given today per MD orders. Tolerated infusion without adverse affects. Vital signs stable. No complaints at this time. Discharged from clinic ambulatory. F/U with [facility] as scheduled.

## 2018-11-27 NOTE — Patient Instructions (Signed)
[facility]  Discharge Instructions:   _______________________________________________________________  Thank you for choosing [facility] at [facility] to provide your oncology and hematology care.  To afford each patient quality time with our providers, please arrive at least 15 minutes before your scheduled appointment.  You need to re-schedule your appointment if you arrive 10 or more minutes late.  We strive to give you quality time with our providers, and arriving late affects you and other patients whose appointments are after yours.  Also, if you no show three or more times for appointments you may be dismissed from the clinic.  Again, thank you for choosing [facility] at [facility]. Our hope is that these requests will allow you access to exceptional care and in a timely manner. _______________________________________________________________  If you have questions after your visit, please contact our office at [phone] between the hours of 8:30 a.m. and 5:00 p.m. Voicemails left after 4:30 p.m. will not be returned until the following business day. _______________________________________________________________  For prescription refill requests, have your pharmacy contact our office. _______________________________________________________________  Recommendations made by the consultant and any test results will be sent to your referring physician. _______________________________________________________________ 

## 2018-11-28 ENCOUNTER — Inpatient Hospital Stay (HOSPITAL_COMMUNITY): Payer: Medicare Other

## 2018-11-28 ENCOUNTER — Inpatient Hospital Stay (HOSPITAL_BASED_OUTPATIENT_CLINIC_OR_DEPARTMENT_OTHER): Payer: Medicare Other | Admitting: Hematology

## 2018-11-28 ENCOUNTER — Ambulatory Visit (HOSPITAL_COMMUNITY): Payer: Medicare Other | Admitting: Hematology

## 2018-11-28 ENCOUNTER — Other Ambulatory Visit: Payer: Self-pay

## 2018-11-28 ENCOUNTER — Ambulatory Visit (HOSPITAL_COMMUNITY): Payer: Medicare Other

## 2018-11-28 ENCOUNTER — Encounter (HOSPITAL_COMMUNITY): Payer: Self-pay

## 2018-11-28 ENCOUNTER — Encounter (HOSPITAL_COMMUNITY): Payer: Self-pay | Admitting: Hematology

## 2018-11-28 VITALS — BP 114/91 | HR 75 | Temp 97.8°F | Resp 16 | Wt 188.5 lb

## 2018-11-28 DIAGNOSIS — Z79899 Other long term (current) drug therapy: Secondary | ICD-10-CM

## 2018-11-28 DIAGNOSIS — R110 Nausea: Secondary | ICD-10-CM

## 2018-11-28 DIAGNOSIS — N189 Chronic kidney disease, unspecified: Secondary | ICD-10-CM

## 2018-11-28 DIAGNOSIS — C770 Secondary and unspecified malignant neoplasm of lymph nodes of head, face and neck: Secondary | ICD-10-CM | POA: Diagnosis not present

## 2018-11-28 DIAGNOSIS — Z5111 Encounter for antineoplastic chemotherapy: Secondary | ICD-10-CM | POA: Diagnosis not present

## 2018-11-28 DIAGNOSIS — C119 Malignant neoplasm of nasopharynx, unspecified: Secondary | ICD-10-CM

## 2018-11-28 DIAGNOSIS — F17210 Nicotine dependence, cigarettes, uncomplicated: Secondary | ICD-10-CM

## 2018-11-28 DIAGNOSIS — Z9081 Acquired absence of spleen: Secondary | ICD-10-CM

## 2018-11-28 MED ORDER — PROCHLORPERAZINE MALEATE 10 MG PO TABS: 10.0000 mg | ORAL_TABLET | Freq: Four times a day (QID) | ORAL | 0 refills | Status: DC | PRN

## 2018-11-28 MED ORDER — SODIUM CHLORIDE 0.9 % IV SOLN
Freq: Once | INTRAVENOUS | Status: AC
  Administered 2018-11-28: 12:00:00 via INTRAVENOUS
  Filled 2018-11-28: qty 1000

## 2018-11-28 MED ORDER — HEPARIN SOD (PORK) LOCK FLUSH 100 UNIT/ML IV SOLN
500.0000 [IU] | Freq: Once | INTRAVENOUS | Status: AC
  Administered 2018-11-28: 500 [IU] via INTRAVENOUS

## 2018-11-28 MED ORDER — LIDOCAINE-PRILOCAINE 2.5-2.5 % EX CREA: 1.0000 "application " | TOPICAL_CREAM | CUTANEOUS | 0 refills | Status: DC | PRN

## 2018-11-28 MED ORDER — SODIUM CHLORIDE 0.9% FLUSH
10.0000 mL | Freq: Once | INTRAVENOUS | Status: AC
  Administered 2018-11-28: 10 mL
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
## 2018-11-28 NOTE — Patient Instructions (Signed)
[facility] Discharge Instructions     Thank you for choosing [facility] at [facility] to provide your oncology and hematology care.  To afford each patient quality time with our provider, please arrive at least 15 minutes before your scheduled appointment time.   If you have a lab appointment with the Cancer Center please come in thru the  Main Entrance and check in at the main information desk  You need to re-schedule your appointment should you arrive 10 or more minutes late.  We strive to give you quality time with our providers, and arriving late affects you and other patients whose appointments are after yours.  Also, if you no show three or more times for appointments you may be dismissed from the clinic at the providers discretion.     Again, thank you for choosing [facility].  Our hope is that these requests will decrease the amount of time that you wait before being seen by our physicians.       _____________________________________________________________  Should you have questions after your visit to [facility], please contact our office at [phone] between the hours of 8:00 a.m. and 4:30 p.m.  Voicemails left after 4:00 p.m. will not be returned until the following business day.  For prescription refill requests, have your pharmacy contact our office and allow 72 hours.    Cancer Center Support Programs:   > Cancer Support Group  2nd Tuesday of the month 1pm-2pm, Journey Room    

## 2018-11-28 NOTE — Patient Instructions (Signed)
[facility] at [facility]  Discharge Instructions:   _______________________________________________________________  Thank you for choosing [facility] at [facility] to provide your oncology and hematology care.  To afford each patient quality time with our providers, please arrive at least 15 minutes before your scheduled appointment.  You need to re-schedule your appointment if you arrive 10 or more minutes late.  We strive to give you quality time with our providers, and arriving late affects you and other patients whose appointments are after yours.  Also, if you no show three or more times for appointments you may be dismissed from the clinic.  Again, thank you for choosing [facility] at [facility]. Our hope is that these requests will allow you access to exceptional care and in a timely manner. _______________________________________________________________  If you have questions after your visit, please contact our office at [phone] between the hours of 8:30 a.m. and 5:00 p.m. Voicemails left after 4:30 p.m. will not be returned until the following business day. _______________________________________________________________  For prescription refill requests, have your pharmacy contact our office. _______________________________________________________________  Recommendations made by the consultant and any test results will be sent to your referring physician. _______________________________________________________________ 

## 2018-11-28 NOTE — Progress Notes (Signed)
Pt presents today for IV fluids. Pt seen by Dr. Delton Coombes today. VSS. Message sent from [facility] NP that patient is ready for fluids. MAR reviewed.   IV fluids administered today per MD orders. Tolerated infusion without adverse affects. Vital signs stable. No complaints at this time. Discharged from clinic ambulatory. F/U with [facility] as scheduled.
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
## 2018-11-29 ENCOUNTER — Encounter (HOSPITAL_COMMUNITY): Payer: Self-pay

## 2018-11-30 ENCOUNTER — Encounter (HOSPITAL_COMMUNITY)
Admission: RE | Admit: 2018-11-30 | Discharge: 2018-11-30 | Disposition: A | Discharge status: Home / Self Care | Payer: Medicare Other | Source: Ambulatory Visit | Attending: General Surgery | Admitting: General Surgery

## 2018-11-30 ENCOUNTER — Inpatient Hospital Stay (HOSPITAL_COMMUNITY): Payer: Medicare Other | Attending: Hematology

## 2018-11-30 ENCOUNTER — Other Ambulatory Visit (HOSPITAL_COMMUNITY): Payer: Self-pay | Admitting: Hematology

## 2018-11-30 DIAGNOSIS — C119 Malignant neoplasm of nasopharynx, unspecified: Secondary | ICD-10-CM | POA: Diagnosis present

## 2018-11-30 DIAGNOSIS — C770 Secondary and unspecified malignant neoplasm of lymph nodes of head, face and neck: Secondary | ICD-10-CM | POA: Insufficient documentation

## 2018-11-30 LAB — COMPREHENSIVE METABOLIC PANEL
ALT: 9 U/L (ref 0–44)
AST: 16 U/L (ref 15–41)
Albumin: 3.8 g/dL (ref 3.5–5.0)
Alkaline Phosphatase: 110 U/L (ref 38–126)
Anion gap: 8 (ref 5–15)
BUN: 13 mg/dL (ref 6–20)
CHLORIDE: 102 mmol/L (ref 98–111)
CO2: 26 mmol/L (ref 22–32)
Calcium: 9.3 mg/dL (ref 8.9–10.3)
Creatinine, Ser: 1.03 mg/dL (ref 0.61–1.24)
GFR calc Af Amer: >60 mL/min (ref >60)
GFR calc non Af Amer: >60 mL/min (ref >60)
Glucose, Bld: 104 mg/dL — ABNORMAL HIGH (ref 70–99)
Potassium: 4 mmol/L (ref 3.5–5.1)
Sodium: 136 mmol/L (ref 135–145)
Total Bilirubin: 0.4 mg/dL (ref 0.3–1.2)
Total Protein: 7.7 g/dL (ref 6.5–8.1)

## 2018-11-30 LAB — CBC WITH DIFFERENTIAL/PLATELET
Abs Immature Granulocytes: 0.02 10*3/uL (ref 0.00–0.07)
Basophils Absolute: 0 10*3/uL (ref 0.0–0.1)
Basophils Relative: 0 %
Eosinophils Absolute: 0.3 10*3/uL (ref 0.0–0.5)
Eosinophils Relative: 3 %
HCT: 44.3 % (ref 39.0–52.0)
Hemoglobin: 13.9 g/dL (ref 13.0–17.0)
Immature Granulocytes: 0 %
Lymphocytes Relative: 18 %
Lymphs Abs: 2.1 10*3/uL (ref 0.7–4.0)
MCH: 31 pg (ref 26.0–34.0)
MCHC: 31.4 g/dL (ref 30.0–36.0)
MCV: 98.9 fL (ref 80.0–100.0)
Monocytes Absolute: 0.9 10*3/uL (ref 0.1–1.0)
Monocytes Relative: 8 %
Neutro Abs: 8.2 10*3/uL — ABNORMAL HIGH (ref 1.7–7.7)
Neutrophils Relative %: 71 %
Platelets: 436 10*3/uL — ABNORMAL HIGH (ref 150–400)
RBC: 4.48 MIL/uL (ref 4.22–5.81)
RDW: 15.2 % (ref 11.5–15.5)
WBC: 11.7 10*3/uL — ABNORMAL HIGH (ref 4.0–10.5)
nRBC: 0 % (ref 0.0–0.2)

## 2018-11-30 LAB — MAGNESIUM: Magnesium: 1.7 mg/dL (ref 1.7–2.4)

## 2018-12-03 ENCOUNTER — Encounter (HOSPITAL_COMMUNITY): Payer: Self-pay | Admitting: Anesthesiology

## 2018-12-03 ENCOUNTER — Other Ambulatory Visit (HOSPITAL_COMMUNITY): Payer: Medicare Other

## 2018-12-03 ENCOUNTER — Encounter (HOSPITAL_COMMUNITY): Payer: Self-pay | Admitting: *Deleted

## 2018-12-03 ENCOUNTER — Ambulatory Visit (HOSPITAL_COMMUNITY): Payer: Medicare Other

## 2018-12-03 MED ORDER — POTASSIUM CHLORIDE 2 MEQ/ML IV SOLN: Freq: Once | INTRAVENOUS | Status: DC

## 2018-12-03 NOTE — Progress Notes (Signed)
I received a call from Tanzania, Therapist, sports at [facility] and she states that patient did not show for radiation today. She spoke with patient's fiance and she states that the is in bed, his throat is hurting and he doesn't feel well.  She is going to get him in to see their physician tomorrow.

## 2018-12-04 ENCOUNTER — Encounter (HOSPITAL_COMMUNITY): Payer: Self-pay | Admitting: Dietician

## 2018-12-04 ENCOUNTER — Ambulatory Visit (HOSPITAL_COMMUNITY): Payer: Medicare Other

## 2018-12-04 ENCOUNTER — Ambulatory Visit (HOSPITAL_COMMUNITY): Payer: Medicare Other | Admitting: Dietician

## 2018-12-04 NOTE — Progress Notes (Signed)
Nutrition Assessment  Reason for Assessment: H/N patient. Restarting tx after pt initiated hiatus. Scheduled for PEG placement.   ASSESSMENT:  58 y/o male w/ PMHx COPD, Opioid abuse (on maintenance Subutex) Anxiety, Tobacco abuse (40+ pack year). He had been initially seen at [facility] back in August (Initial consult 8/20) after he was referred by [facility] ENT specialist for nasopharyngeal mass. Biopsy + for SCC. Pt begun chemo 10/10, but quickly abandoned tx and stopped coming to appts- reportedly d/t the side effects of tx (last apcc visit 10/31)). Pt  not seen between 11/1-1/2. However, pt recently represented to [facility] and voices that he wishes to re attempt tx.    Today, pt is a no show for his appointment. Of note, pt missed his radiation appts yesterday and today as well.   Info from chart Pt had repeat imaging performed at end of January and was seen back at [facility] 2/3, during which a PEG was recommended; pt agreed. Restarted chemo 2/17. As of today, he is s/p 1 chemo tx w/ several sessions of IVF. He is tentatively scheduled to have PEG placed 2/28.   Wt history: Pts UBW appears to be 200-210 lbs. At his initial [facility] appt on 8/20, he was weighed at 205.2 lbs. He maintained wt until initiating chemo in mid Oct, after which his weight quickly dropped. Last wt prior to loss of contact was 195.2 lbs. He represented to [facility] 2/3 at 191 lbs.   Pt's most recent wt on file was from his last office visit on 2/19; he was 188.5 lbs. With this measurement, pt has lost 10% of his BW since 10/10 (<6 months); this meets criteria for malnutrition  Wt Readings from Last 10 Encounters:  11/28/18 188 lb 8 oz (85.5 kg)  11/20/18 189 lb 3.2 oz (85.8 kg)  11/12/18 191 lb (86.6 kg)  08/09/18 195 lb 3.2 oz (88.5 kg)  07/26/18 201 lb 3.2 oz (91.3 kg)  07/19/18 210 lb (95.3 kg)  07/16/18 216 lb (98 kg)  07/12/18 216 lb (98 kg)  07/09/18 206 lb (93.4 kg)  07/05/18 200 lb (90.7 kg)   MEDICATIONS:  Chemo:  Cisplatin Other: MVI, Seroquel, Subutex, Xanax   LABS:  2/21 Unremarkable  Recent Labs  Lab 11/30/18 1042  NA 136  K 4.0  CL 102  CO2 26  BUN 13  CREATININE 1.03  CALCIUM 9.3  MG 1.7  GLUCOSE 104*   ANTHROPOMETRICS: Height:  Ht Readings from Last 1 Encounters:  07/19/18 5\' 9"  (1.753 m)   Weight:  Wt Readings from Last 1 Encounters:  11/28/18 188 lb 8 oz (85.5 kg)   BMI:  BMI Readings from Last 1 Encounters:  11/28/18 27.84 kg/m   UBW: From chart, UBW appears to be 200-210 lbs. IBW: 160 lbs (72.73 kg) Wt changes: as of 2/19: pt has lost 10% of his BW since 10/10 (<6 months); this meets criteria for malnutrition  ESTIMATED ENERGY NEEDS:  Kcal: 2300-2500 kcals (27-29 kcal/kg bw) Protein:  111-128 g Pro (1.3-1.5g/kg bw) Fluid: 2.3-2.5 L fluid (53ml/kcal)  NUTRITION - FOCUSED PHYSICAL EXAM: Deferred  NUTRITION DIAGNOSIS:  Increased Protein/kcal needs r/t cancer and cancer related treatments as evidenced by the recommended nutrition needs for this treatment regimen  DOCUMENTATION CODES:  N/A  INTERVENTION:  Pt is a no show for his appointment. He apparently also missed his last 2 radiation appointments.  Per discussion with nurse navigator, care team discussion will be held to determine if pt is a candidate for treatment  going forward given his non adherence to treatment plan.  RD will monitor outcome of discussion  GOAL:  Oral intake to meet >90% of needs, wt stability  MONITOR:  If pt still deemed candidate for tx  Next Visit: TBD.  Burtis Junes RD, LDN, CNSC Clinical Nutrition Available Tues-Sat via Pager: [phone] 12/04/2018 2:13 PM

## 2018-12-04 NOTE — Progress Notes (Signed)
Nutrition Assessment  Reason for Assessment: H/N patient. Restarting tx after pt initiated hiatus. Scheduled for PEG placement.   ASSESSMENT:  58 y/o male w/ PMHx COPD, Opioid abuse (on maintenance Subutex) Anxiety, Tobacco abuse (40+ pack year). He had been initially seen at [facility] back in August (Initial consult 8/20) after he was referred by [facility] ENT specialist for nasopharyngeal mass. Biopsy + for SCC. Pt begun chemo 10/10, but quickly abandoned tx and stopped coming to appts- reportedly d/t the side effects of tx (last apcc visit 10/31)). Pt  not seen between 11/1-1/2. However, pt recently represented to [facility] and voices that he wishes to re attempt tx.    Today, pt is a no show for his appointment. Of note, pt missed his radiation appts yesterday and today as well.   Info from chart Pt had repeat imaging performed at end of January and was seen back at [facility] 2/3, during which a PEG was recommended; pt agreed. Restarted chemo 2/17. As of today, he is s/p 1 chemo tx w/ several sessions of IVF. He is tentatively scheduled to have PEG placed 2/28.   Wt history: Pts UBW appears to be 200-210 lbs. At his initial [facility] appt on 8/20, he was weighed at 205.2 lbs. He maintained wt until initiating chemo in mid Oct, after which his weight quickly dropped. Last wt prior to loss of contact was 195.2 lbs. He represented to [facility] 2/3 at 191 lbs.   Pt's most recent wt on file was from his last office visit on 2/19; he was 188.5 lbs. With this measurement, pt has lost 10% of his BW since 10/10 (<6 months); this meets criteria for malnutrition  Wt Readings from Last 10 Encounters:  11/28/18 188 lb 8 oz (85.5 kg)  11/20/18 189 lb 3.2 oz (85.8 kg)  11/12/18 191 lb (86.6 kg)  08/09/18 195 lb 3.2 oz (88.5 kg)  07/26/18 201 lb 3.2 oz (91.3 kg)  07/19/18 210 lb (95.3 kg)  07/16/18 216 lb (98 kg)  07/12/18 216 lb (98 kg)  07/09/18 206 lb (93.4 kg)  07/05/18 200 lb (90.7 kg)   MEDICATIONS:  Chemo:  Cisplatin Other: MVI, Seroquel, Subutex, Xanax   LABS:  2/21 Unremarkable  Recent Labs  Lab 11/30/18 1042  NA 136  K 4.0  CL 102  CO2 26  BUN 13  CREATININE 1.03  CALCIUM 9.3  MG 1.7  GLUCOSE 104*   ANTHROPOMETRICS: Height:  Ht Readings from Last 1 Encounters:  07/19/18 5\' 9"  (1.753 m)   Weight:  Wt Readings from Last 1 Encounters:  11/28/18 188 lb 8 oz (85.5 kg)   BMI:  BMI Readings from Last 1 Encounters:  11/28/18 27.84 kg/m   UBW: From chart, UBW appears to be 200-210 lbs. IBW: 160 lbs (72.73 kg) Wt changes: as of 2/19: pt has lost 10% of his BW since 10/10 (<6 months); this meets criteria for malnutrition  ESTIMATED ENERGY NEEDS:  Kcal: 2300-2500 kcals (27-29 kcal/kg bw) Protein:  111-128 g Pro (1.3-1.5g/kg bw) Fluid: 2.3-2.5 L fluid (40ml/kcal)  NUTRITION - FOCUSED PHYSICAL EXAM: Deferred  NUTRITION DIAGNOSIS:  Increased Protein/kcal needs r/t cancer and cancer related treatments as evidenced by the recommended nutrition needs for this treatment regimen  DOCUMENTATION CODES:  N/A  INTERVENTION:  Pt is a no show for his appointment. He apparently also missed his last 2 radiation appointments.  Per discussion with nurse navigator, care team discussion will be held to determine if pt is a candidate for treatment  going forward given his non adherence to treatment plan.  RD will monitor outcome of discussion  GOAL:  Oral intake to meet >90% of needs, wt stability  MONITOR:  If pt still deemed candidate for tx  Next Visit: TBD.  Burtis Junes RD, LDN, CNSC Clinical Nutrition Available Tues-Sat via Pager: [phone] 12/04/2018 12:39 PM

## 2018-12-05 ENCOUNTER — Ambulatory Visit (HOSPITAL_COMMUNITY): Payer: Medicare Other | Admitting: Hematology

## 2018-12-05 ENCOUNTER — Ambulatory Visit (HOSPITAL_COMMUNITY): Payer: Medicare Other

## 2018-12-05 ENCOUNTER — Other Ambulatory Visit (HOSPITAL_COMMUNITY): Payer: Self-pay | Admitting: Internal Medicine

## 2018-12-06 ENCOUNTER — Ambulatory Visit (HOSPITAL_COMMUNITY): Payer: Medicare Other

## 2018-12-07 ENCOUNTER — Other Ambulatory Visit (HOSPITAL_COMMUNITY): Payer: Medicare Other

## 2018-12-07 ENCOUNTER — Ambulatory Visit (HOSPITAL_COMMUNITY): Admission: RE | Admit: 2018-12-07 | Payer: Medicare Other | Source: Home / Self Care | Admitting: General Surgery

## 2018-12-07 ENCOUNTER — Encounter (HOSPITAL_COMMUNITY): Admission: RE | Payer: Self-pay | Source: Home / Self Care

## 2018-12-07 ENCOUNTER — Ambulatory Visit (HOSPITAL_COMMUNITY): Payer: Medicare Other

## 2018-12-07 SURGERY — ESOPHAGOGASTRODUODENOSCOPY (EGD) WITH PROPOFOL
Anesthesia: General

## 2018-12-07 NOTE — OR Nursing (Signed)
Patient was on the schedule to have procedure. Did not show up for the procedure. Actually Dr. Constance Haw notified OR that he probably would not show up.Procedure was removed from schedule.

## 2018-12-10 ENCOUNTER — Ambulatory Visit (HOSPITAL_COMMUNITY): Payer: Medicare Other | Admitting: Nurse Practitioner

## 2018-12-10 ENCOUNTER — Other Ambulatory Visit (HOSPITAL_COMMUNITY): Payer: Medicare Other

## 2018-12-10 ENCOUNTER — Encounter (HOSPITAL_COMMUNITY): Payer: Self-pay | Admitting: *Deleted

## 2018-12-10 ENCOUNTER — Ambulatory Visit (HOSPITAL_COMMUNITY): Payer: Medicare Other

## 2018-12-10 NOTE — Progress Notes (Signed)
I had a voicemail from over the weekend from patient's significant other, Shana. She states that patient thinks he has the flu and isn't going to be here on Monday or Tuesday for any treatments.    I have notified Tanzania, RN in [place] at [facility], so they know he won't be there for treatment either day.    We will continue to support patient as needed.

## 2018-12-11 ENCOUNTER — Ambulatory Visit (HOSPITAL_COMMUNITY): Payer: Medicare Other

## 2018-12-11 ENCOUNTER — Encounter (HOSPITAL_COMMUNITY): Payer: Medicare Other | Admitting: Dietician

## 2018-12-12 ENCOUNTER — Ambulatory Visit (HOSPITAL_COMMUNITY): Payer: Medicare Other

## 2018-12-12 ENCOUNTER — Other Ambulatory Visit (HOSPITAL_COMMUNITY): Payer: Medicare Other

## 2018-12-12 ENCOUNTER — Ambulatory Visit (HOSPITAL_COMMUNITY): Payer: Medicare Other | Admitting: Hematology

## 2018-12-12 ENCOUNTER — Ambulatory Visit (HOSPITAL_COMMUNITY): Payer: Medicare Other | Admitting: Internal Medicine

## 2018-12-13 ENCOUNTER — Ambulatory Visit (HOSPITAL_COMMUNITY): Payer: Medicare Other

## 2018-12-14 ENCOUNTER — Ambulatory Visit (HOSPITAL_COMMUNITY): Payer: Medicare Other

## 2018-12-14 ENCOUNTER — Encounter (HOSPITAL_COMMUNITY): Payer: Medicare Other

## 2018-12-17 ENCOUNTER — Ambulatory Visit (HOSPITAL_COMMUNITY): Payer: Medicare Other

## 2018-12-17 ENCOUNTER — Ambulatory Visit (HOSPITAL_COMMUNITY): Payer: Medicare Other | Admitting: Hematology

## 2018-12-17 ENCOUNTER — Other Ambulatory Visit (HOSPITAL_COMMUNITY): Payer: Medicare Other

## 2018-12-18 ENCOUNTER — Ambulatory Visit (HOSPITAL_COMMUNITY): Payer: Medicare Other

## 2018-12-18 ENCOUNTER — Encounter (HOSPITAL_COMMUNITY): Payer: Medicare Other | Admitting: Dietician

## 2018-12-19 ENCOUNTER — Ambulatory Visit (HOSPITAL_COMMUNITY): Payer: Medicare Other

## 2018-12-19 ENCOUNTER — Other Ambulatory Visit (HOSPITAL_COMMUNITY): Payer: Medicare Other

## 2018-12-19 ENCOUNTER — Ambulatory Visit (HOSPITAL_COMMUNITY): Payer: Medicare Other | Admitting: Hematology

## 2018-12-20 ENCOUNTER — Ambulatory Visit (HOSPITAL_COMMUNITY): Payer: Medicare Other

## 2018-12-21 ENCOUNTER — Encounter (HOSPITAL_COMMUNITY): Payer: Medicare Other

## 2018-12-21 ENCOUNTER — Ambulatory Visit (HOSPITAL_COMMUNITY): Payer: Medicare Other

## 2018-12-24 ENCOUNTER — Other Ambulatory Visit (HOSPITAL_COMMUNITY): Payer: Medicare Other

## 2018-12-24 ENCOUNTER — Ambulatory Visit (HOSPITAL_COMMUNITY): Payer: Medicare Other | Admitting: Hematology

## 2018-12-24 ENCOUNTER — Ambulatory Visit (HOSPITAL_COMMUNITY): Payer: Medicare Other

## 2018-12-25 ENCOUNTER — Ambulatory Visit (HOSPITAL_COMMUNITY): Payer: Medicare Other

## 2018-12-25 ENCOUNTER — Encounter (HOSPITAL_COMMUNITY): Payer: Medicare Other | Admitting: Dietician

## 2018-12-26 ENCOUNTER — Other Ambulatory Visit (HOSPITAL_COMMUNITY): Payer: Medicare Other

## 2018-12-26 ENCOUNTER — Ambulatory Visit (HOSPITAL_COMMUNITY): Payer: Medicare Other | Admitting: Hematology

## 2018-12-26 ENCOUNTER — Ambulatory Visit (HOSPITAL_COMMUNITY): Payer: Medicare Other

## 2018-12-27 ENCOUNTER — Ambulatory Visit (HOSPITAL_COMMUNITY): Payer: Medicare Other

## 2018-12-28 ENCOUNTER — Encounter (HOSPITAL_COMMUNITY): Payer: Medicare Other

## 2018-12-28 ENCOUNTER — Ambulatory Visit (HOSPITAL_COMMUNITY): Payer: Medicare Other

## 2018-12-31 ENCOUNTER — Ambulatory Visit (HOSPITAL_COMMUNITY): Payer: Medicare Other

## 2018-12-31 ENCOUNTER — Ambulatory Visit (HOSPITAL_COMMUNITY): Payer: Medicare Other | Admitting: Hematology

## 2018-12-31 ENCOUNTER — Other Ambulatory Visit (HOSPITAL_COMMUNITY): Payer: Medicare Other

## 2019-01-01 ENCOUNTER — Encounter (HOSPITAL_COMMUNITY): Payer: Medicare Other | Admitting: Dietician

## 2019-01-01 ENCOUNTER — Ambulatory Visit (HOSPITAL_COMMUNITY): Payer: Medicare Other

## 2019-01-01 ENCOUNTER — Ambulatory Visit (HOSPITAL_COMMUNITY): Payer: Medicare Other | Admitting: Hematology

## 2019-01-02 ENCOUNTER — Ambulatory Visit (HOSPITAL_COMMUNITY): Payer: Medicare Other

## 2019-01-07 ENCOUNTER — Other Ambulatory Visit: Payer: Self-pay

## 2019-01-08 ENCOUNTER — Encounter (HOSPITAL_COMMUNITY): Payer: Self-pay | Admitting: Hematology

## 2019-01-08 ENCOUNTER — Inpatient Hospital Stay (HOSPITAL_COMMUNITY): Payer: Medicare Other | Attending: Hematology | Admitting: Hematology

## 2019-01-08 VITALS — BP 142/83 | HR 103 | Temp 97.5°F | Resp 18 | Wt 182.0 lb

## 2019-01-08 DIAGNOSIS — N189 Chronic kidney disease, unspecified: Secondary | ICD-10-CM | POA: Diagnosis not present

## 2019-01-08 DIAGNOSIS — F40240 Claustrophobia: Secondary | ICD-10-CM | POA: Diagnosis not present

## 2019-01-08 DIAGNOSIS — J449 Chronic obstructive pulmonary disease, unspecified: Secondary | ICD-10-CM | POA: Diagnosis not present

## 2019-01-08 DIAGNOSIS — C119 Malignant neoplasm of nasopharynx, unspecified: Secondary | ICD-10-CM | POA: Diagnosis not present

## 2019-01-08 DIAGNOSIS — F17210 Nicotine dependence, cigarettes, uncomplicated: Secondary | ICD-10-CM | POA: Diagnosis not present

## 2019-01-08 DIAGNOSIS — Z79899 Other long term (current) drug therapy: Secondary | ICD-10-CM | POA: Diagnosis not present
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
## 2019-01-08 NOTE — Patient Instructions (Signed)
[facility] at [facility] Discharge Instructions  You were seen today by Dr. Delton Coombes. He went over have you've been feeling. He will schedule you for a PET scan and see you back after the scan for follow up.   Thank you for choosing [facility] at [facility] to provide your oncology and hematology care.  To afford each patient quality time with our provider, please arrive at least 15 minutes before your scheduled appointment time.   If you have a lab appointment with the [facility] please come in thru the  Main Entrance and check in at the main information desk  You need to re-schedule your appointment should you arrive 10 or more minutes late.  We strive to give you quality time with our providers, and arriving late affects you and other patients whose appointments are after yours.  Also, if you no show three or more times for appointments you may be dismissed from the clinic at the providers discretion.     Again, thank you for choosing [facility].  Our hope is that these requests will decrease the amount of time that you wait before being seen by our physicians.       _____________________________________________________________  Should you have questions after your visit to [facility], please contact our office at [phone] between the hours of 8:00 a.m. and 4:30 p.m.  Voicemails left after 4:00 p.m. will not be returned until the following business day.  For prescription refill requests, have your pharmacy contact our office and allow 72 hours.    Cancer Center Support Programs:   > Cancer Support Group  2nd Tuesday of the month 1pm-2pm, Journey Room
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
## 2019-01-17 ENCOUNTER — Ambulatory Visit (HOSPITAL_COMMUNITY)
Admission: RE | Admit: 2019-01-17 | Discharge: 2019-01-17 | Disposition: A | Discharge status: Home / Self Care | Payer: Medicare Other | Source: Ambulatory Visit | Attending: Hematology | Admitting: Hematology

## 2019-01-17 ENCOUNTER — Other Ambulatory Visit: Payer: Self-pay

## 2019-01-17 DIAGNOSIS — I2510 Atherosclerotic heart disease of native coronary artery without angina pectoris: Secondary | ICD-10-CM | POA: Insufficient documentation

## 2019-01-17 DIAGNOSIS — C119 Malignant neoplasm of nasopharynx, unspecified: Secondary | ICD-10-CM | POA: Diagnosis present

## 2019-01-17 DIAGNOSIS — I700 Atherosclerosis of aorta: Secondary | ICD-10-CM | POA: Diagnosis not present

## 2019-01-17 LAB — GLUCOSE, CAPILLARY: Glucose-Capillary: 104 mg/dL — ABNORMAL HIGH (ref 70–99)

## 2019-01-17 MED ORDER — FLUDEOXYGLUCOSE F - 18 (FDG) INJECTION: 8.9000 | Freq: Once | INTRAVENOUS | Status: DC | PRN

## 2019-01-24 ENCOUNTER — Inpatient Hospital Stay (HOSPITAL_COMMUNITY): Payer: Medicare Other | Attending: Hematology | Admitting: Hematology

## 2019-01-24 ENCOUNTER — Encounter (HOSPITAL_COMMUNITY): Payer: Self-pay | Admitting: Hematology

## 2019-01-24 ENCOUNTER — Other Ambulatory Visit: Payer: Self-pay

## 2019-01-24 VITALS — HR 111 | Temp 97.5°F | Resp 20 | Wt 177.0 lb

## 2019-01-24 DIAGNOSIS — M542 Cervicalgia: Secondary | ICD-10-CM | POA: Diagnosis not present

## 2019-01-24 DIAGNOSIS — C119 Malignant neoplasm of nasopharynx, unspecified: Secondary | ICD-10-CM

## 2019-01-24 DIAGNOSIS — Z79899 Other long term (current) drug therapy: Secondary | ICD-10-CM

## 2019-01-24 DIAGNOSIS — F17210 Nicotine dependence, cigarettes, uncomplicated: Secondary | ICD-10-CM

## 2019-01-24 DIAGNOSIS — C770 Secondary and unspecified malignant neoplasm of lymph nodes of head, face and neck: Secondary | ICD-10-CM | POA: Diagnosis not present

## 2019-01-24 DIAGNOSIS — Z9221 Personal history of antineoplastic chemotherapy: Secondary | ICD-10-CM

## 2019-01-24 DIAGNOSIS — K769 Liver disease, unspecified: Secondary | ICD-10-CM

## 2019-01-24 DIAGNOSIS — Z7982 Long term (current) use of aspirin: Secondary | ICD-10-CM

## 2019-01-24 MED ORDER — TRAMADOL HCL 50 MG PO TABS: 50.0000 mg | ORAL_TABLET | Freq: Two times a day (BID) | ORAL | 0 refills | Status: DC | PRN
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
## 2019-01-24 NOTE — Patient Instructions (Addendum)
[place] at [facility] Discharge Instructions  You were seen today by Dr. Delton Coombes. He went over your recent scan results. He would like you to have CT of your abdomen. He will see you back after scan for follow up.   Thank you for choosing [place] at [facility] to provide your oncology and hematology care.  To afford each patient quality time with our provider, please arrive at least 15 minutes before your scheduled appointment time.   If you have a lab appointment with the [facility] please come in thru the  Main Entrance and check in at the main information desk  You need to re-schedule your appointment should you arrive 10 or more minutes late.  We strive to give you quality time with our providers, and arriving late affects you and other patients whose appointments are after yours.  Also, if you no show three or more times for appointments you may be dismissed from the clinic at the providers discretion.     Again, thank you for choosing [facility].  Our hope is that these requests will decrease the amount of time that you wait before being seen by our physicians.       _____________________________________________________________  Should you have questions after your visit to [facility], please contact our office at [phone] between the hours of 8:00 a.m. and 4:30 p.m.  Voicemails left after 4:00 p.m. will not be returned until the following business day.  For prescription refill requests, have your pharmacy contact our office and allow 72 hours.    Cancer Center Support Programs:   > Cancer Support Group  2nd Tuesday of the month 1pm-2pm, Journey Room

## 2019-01-29 ENCOUNTER — Other Ambulatory Visit: Payer: Self-pay

## 2019-01-29 ENCOUNTER — Ambulatory Visit (HOSPITAL_COMMUNITY)
Admission: RE | Admit: 2019-01-29 | Discharge: 2019-01-29 | Disposition: A | Discharge status: Home / Self Care | Payer: Medicare Other | Source: Ambulatory Visit | Attending: Hematology | Admitting: Hematology

## 2019-01-29 DIAGNOSIS — K769 Liver disease, unspecified: Secondary | ICD-10-CM | POA: Diagnosis not present

## 2019-01-29 MED ORDER — IOHEXOL 300 MG/ML  SOLN
100.0000 mL | Freq: Once | INTRAMUSCULAR | Status: AC | PRN
  Administered 2019-01-29: 100 mL via INTRAVENOUS

## 2019-01-31 ENCOUNTER — Encounter (HOSPITAL_COMMUNITY): Payer: Self-pay | Admitting: Hematology

## 2019-01-31 ENCOUNTER — Other Ambulatory Visit: Payer: Self-pay

## 2019-01-31 ENCOUNTER — Inpatient Hospital Stay (HOSPITAL_BASED_OUTPATIENT_CLINIC_OR_DEPARTMENT_OTHER): Payer: Medicare Other | Admitting: Hematology

## 2019-01-31 VITALS — BP 128/73 | HR 99 | Temp 98.1°F | Resp 18 | Wt 178.0 lb

## 2019-01-31 DIAGNOSIS — C119 Malignant neoplasm of nasopharynx, unspecified: Secondary | ICD-10-CM | POA: Diagnosis not present

## 2019-01-31 DIAGNOSIS — K769 Liver disease, unspecified: Secondary | ICD-10-CM

## 2019-01-31 DIAGNOSIS — M542 Cervicalgia: Secondary | ICD-10-CM | POA: Diagnosis not present

## 2019-01-31 DIAGNOSIS — Z7982 Long term (current) use of aspirin: Secondary | ICD-10-CM

## 2019-01-31 DIAGNOSIS — Z9221 Personal history of antineoplastic chemotherapy: Secondary | ICD-10-CM

## 2019-01-31 DIAGNOSIS — C770 Secondary and unspecified malignant neoplasm of lymph nodes of head, face and neck: Secondary | ICD-10-CM | POA: Diagnosis not present

## 2019-01-31 DIAGNOSIS — F17210 Nicotine dependence, cigarettes, uncomplicated: Secondary | ICD-10-CM

## 2019-01-31 DIAGNOSIS — Z79899 Other long term (current) drug therapy: Secondary | ICD-10-CM
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
## 2019-01-31 NOTE — Patient Instructions (Addendum)
[place] at [facility] Discharge Instructions  You were seen today by Dr. Delton Coombes. He went over your recent scan results. There is a new area on your liver, we need to get a biopsy of this area so that we know if it is your cancer. He will see you back after your biopsy for labs and follow up.   Thank you for choosing [place] at [facility] to provide your oncology and hematology care.  To afford each patient quality time with our provider, please arrive at least 15 minutes before your scheduled appointment time.   If you have a lab appointment with the [facility] please come in thru the  Main Entrance and check in at the main information desk  You need to re-schedule your appointment should you arrive 10 or more minutes late.  We strive to give you quality time with our providers, and arriving late affects you and other patients whose appointments are after yours.  Also, if you no show three or more times for appointments you may be dismissed from the clinic at the providers discretion.     Again, thank you for choosing [facility].  Our hope is that these requests will decrease the amount of time that you wait before being seen by our physicians.       _____________________________________________________________  Should you have questions after your visit to [facility], please contact our office at [phone] between the hours of 8:00 a.m. and 4:30 p.m.  Voicemails left after 4:00 p.m. will not be returned until the following business day.  For prescription refill requests, have your pharmacy contact our office and allow 72 hours.    Cancer Center Support Programs:   > Cancer Support Group  2nd Tuesday of the month 1pm-2pm, Journey Room
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
## 2019-02-05 ENCOUNTER — Other Ambulatory Visit: Payer: Self-pay | Admitting: Radiology

## 2019-02-06 ENCOUNTER — Other Ambulatory Visit: Payer: Self-pay | Admitting: Student

## 2019-02-07 ENCOUNTER — Other Ambulatory Visit: Payer: Self-pay

## 2019-02-07 ENCOUNTER — Encounter (HOSPITAL_COMMUNITY): Payer: Self-pay

## 2019-02-07 ENCOUNTER — Ambulatory Visit (HOSPITAL_COMMUNITY)
Admission: RE | Admit: 2019-02-07 | Discharge: 2019-02-07 | Disposition: A | Discharge status: Home / Self Care | Payer: Medicare Other | Source: Ambulatory Visit | Attending: Hematology | Admitting: Hematology

## 2019-02-07 DIAGNOSIS — F17210 Nicotine dependence, cigarettes, uncomplicated: Secondary | ICD-10-CM | POA: Insufficient documentation

## 2019-02-07 DIAGNOSIS — M542 Cervicalgia: Secondary | ICD-10-CM | POA: Diagnosis not present

## 2019-02-07 DIAGNOSIS — J449 Chronic obstructive pulmonary disease, unspecified: Secondary | ICD-10-CM | POA: Diagnosis not present

## 2019-02-07 DIAGNOSIS — K769 Liver disease, unspecified: Secondary | ICD-10-CM | POA: Diagnosis not present

## 2019-02-07 DIAGNOSIS — Z79899 Other long term (current) drug therapy: Secondary | ICD-10-CM | POA: Insufficient documentation

## 2019-02-07 DIAGNOSIS — M1990 Unspecified osteoarthritis, unspecified site: Secondary | ICD-10-CM | POA: Diagnosis not present

## 2019-02-07 DIAGNOSIS — C119 Malignant neoplasm of nasopharynx, unspecified: Secondary | ICD-10-CM | POA: Insufficient documentation

## 2019-02-07 DIAGNOSIS — F419 Anxiety disorder, unspecified: Secondary | ICD-10-CM | POA: Insufficient documentation

## 2019-02-07 LAB — PROTIME-INR
INR: 1 (ref 0.8–1.2)
Prothrombin Time: 13.6 seconds (ref 11.4–15.2)

## 2019-02-07 LAB — CBC WITH DIFFERENTIAL/PLATELET
Abs Immature Granulocytes: 0.04 10*3/uL (ref 0.00–0.07)
Basophils Absolute: 0.1 10*3/uL (ref 0.0–0.1)
Basophils Relative: 1 %
Eosinophils Absolute: 0.4 10*3/uL (ref 0.0–0.5)
Eosinophils Relative: 4 %
HCT: 40.6 % (ref 39.0–52.0)
Hemoglobin: 13.7 g/dL (ref 13.0–17.0)
Immature Granulocytes: 0 %
Lymphocytes Relative: 22 %
Lymphs Abs: 2.6 10*3/uL (ref 0.7–4.0)
MCH: 32.4 pg (ref 26.0–34.0)
MCHC: 33.7 g/dL (ref 30.0–36.0)
MCV: 96 fL (ref 80.0–100.0)
Monocytes Absolute: 0.8 10*3/uL (ref 0.1–1.0)
Monocytes Relative: 7 %
Neutro Abs: 7.7 10*3/uL (ref 1.7–7.7)
Neutrophils Relative %: 66 %
Platelets: 484 10*3/uL — ABNORMAL HIGH (ref 150–400)
RBC: 4.23 MIL/uL (ref 4.22–5.81)
RDW: 16.2 % — ABNORMAL HIGH (ref 11.5–15.5)
WBC: 11.7 10*3/uL — ABNORMAL HIGH (ref 4.0–10.5)
nRBC: 0 % (ref 0.0–0.2)

## 2019-02-07 LAB — COMPREHENSIVE METABOLIC PANEL
ALT: 24 U/L (ref 0–44)
AST: 33 U/L (ref 15–41)
Albumin: 3.6 g/dL (ref 3.5–5.0)
Alkaline Phosphatase: 145 U/L — ABNORMAL HIGH (ref 38–126)
Anion gap: 11 (ref 5–15)
BUN: 16 mg/dL (ref 6–20)
CO2: 23 mmol/L (ref 22–32)
Calcium: 9.7 mg/dL (ref 8.9–10.3)
Chloride: 105 mmol/L (ref 98–111)
Creatinine, Ser: 1.13 mg/dL (ref 0.61–1.24)
GFR calc Af Amer: >60 mL/min (ref >60)
GFR calc non Af Amer: >60 mL/min (ref >60)
Glucose, Bld: 82 mg/dL (ref 70–99)
Potassium: 4.3 mmol/L (ref 3.5–5.1)
Sodium: 139 mmol/L (ref 135–145)
Total Bilirubin: 0.3 mg/dL (ref 0.3–1.2)
Total Protein: 7.7 g/dL (ref 6.5–8.1)

## 2019-02-07 MED ORDER — HYDROCODONE-ACETAMINOPHEN 5-325 MG PO TABS: 1.0000 | ORAL_TABLET | ORAL | Status: DC | PRN

## 2019-02-07 MED ORDER — FENTANYL CITRATE (PF) 100 MCG/2ML IJ SOLN
INTRAMUSCULAR | Status: AC | PRN
  Administered 2019-02-07 (×2): 25 ug via INTRAVENOUS
  Administered 2019-02-07: 50 ug via INTRAVENOUS

## 2019-02-07 MED ORDER — FENTANYL CITRATE (PF) 100 MCG/2ML IJ SOLN
INTRAMUSCULAR | Status: AC
  Filled 2019-02-07: qty 2

## 2019-02-07 MED ORDER — GELATIN ABSORBABLE 12-7 MM EX MISC
CUTANEOUS | Status: AC
  Filled 2019-02-07: qty 1

## 2019-02-07 MED ORDER — MIDAZOLAM HCL 2 MG/2ML IJ SOLN
INTRAMUSCULAR | Status: AC | PRN
  Administered 2019-02-07 (×2): 0.5 mg via INTRAVENOUS
  Administered 2019-02-07: 1 mg via INTRAVENOUS

## 2019-02-07 MED ORDER — LIDOCAINE HCL (PF) 1 % IJ SOLN
INTRAMUSCULAR | Status: AC
  Filled 2019-02-07: qty 30

## 2019-02-07 MED ORDER — MIDAZOLAM HCL 2 MG/2ML IJ SOLN
INTRAMUSCULAR | Status: AC
  Filled 2019-02-07: qty 2

## 2019-02-07 MED ORDER — SODIUM CHLORIDE 0.9 % IV SOLN
INTRAVENOUS | Status: DC
  Administered 2019-02-07: 11:00:00 via INTRAVENOUS

## 2019-02-07 NOTE — Progress Notes (Signed)
Patient D/C home. Patient VSS and in no distress. D/C paperwork explained to patient and patient verbalized understanding.

## 2019-02-07 NOTE — Procedures (Signed)
Interventional Radiology Procedure:   Indications: Nasopharyngeal cancer and suspicious liver lesion  Procedure: US guided liver lesion biopsy  Findings: Subtle hypoechoic lesion in right hepatic lobe.  Core biopsies obtained.  Complications: None     EBL: Less than 10 ml  Plan: Bedrest 3 hours.     Drayven Marchena R. Anselm Pancoast, MD  Pager: [phone]
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
## 2019-02-12 ENCOUNTER — Ambulatory Visit (HOSPITAL_COMMUNITY): Payer: Medicare Other | Admitting: Hematology

## 2019-02-13 ENCOUNTER — Ambulatory Visit (HOSPITAL_COMMUNITY): Payer: Medicare Other | Admitting: Hematology

## 2019-02-13 ENCOUNTER — Inpatient Hospital Stay (HOSPITAL_COMMUNITY): Payer: Medicare Other | Attending: Hematology | Admitting: Hematology

## 2019-02-13 ENCOUNTER — Other Ambulatory Visit: Payer: Self-pay

## 2019-02-13 ENCOUNTER — Encounter (HOSPITAL_COMMUNITY): Payer: Self-pay | Admitting: Hematology

## 2019-02-13 ENCOUNTER — Encounter (HOSPITAL_COMMUNITY): Payer: Self-pay | Admitting: *Deleted

## 2019-02-13 DIAGNOSIS — C770 Secondary and unspecified malignant neoplasm of lymph nodes of head, face and neck: Secondary | ICD-10-CM | POA: Diagnosis not present

## 2019-02-13 DIAGNOSIS — F17210 Nicotine dependence, cigarettes, uncomplicated: Secondary | ICD-10-CM | POA: Diagnosis not present

## 2019-02-13 DIAGNOSIS — Z5111 Encounter for antineoplastic chemotherapy: Secondary | ICD-10-CM | POA: Insufficient documentation

## 2019-02-13 DIAGNOSIS — J449 Chronic obstructive pulmonary disease, unspecified: Secondary | ICD-10-CM | POA: Diagnosis not present

## 2019-02-13 DIAGNOSIS — C119 Malignant neoplasm of nasopharynx, unspecified: Secondary | ICD-10-CM | POA: Diagnosis not present

## 2019-02-13 DIAGNOSIS — Z7189 Other specified counseling: Secondary | ICD-10-CM

## 2019-02-13 DIAGNOSIS — N189 Chronic kidney disease, unspecified: Secondary | ICD-10-CM

## 2019-02-13 DIAGNOSIS — M542 Cervicalgia: Secondary | ICD-10-CM | POA: Diagnosis not present

## 2019-02-13 NOTE — Progress Notes (Signed)
I spoke with Zachary Dickerson in pathology and ordered PD-L1 on accession # [id number].
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
## 2019-02-13 NOTE — Progress Notes (Signed)
DISCONTINUE ON PATHWAY REGIMEN - Head and Neck   Cisplatin 40 mg/m2 IV D1 q7 Days + RT:   A cycle is every 7 days:     Cisplatin   **Always confirm dose/schedule in your pharmacy ordering system**  Cisplatin 80 mg/m2 IV D1 + 5-Fluorouracil 1,000 mg/m2/day CIV D1,2,3,4 q28 Days:   A cycle is every 28 days:     Cisplatin      5-Fluorouracil   **Always confirm dose/schedule in your pharmacy ordering system**  REASON: Disease Progression PRIOR TREATMENT: [id number]: Conventional Radiation with Concurrent Cisplatin 40 mg/m2 D1 q7 Days x 6 Weeks, Followed After Completion of RT by Cisplatin 80 mg/m2 D1 + 5-Fluorouracil 1,000 mg/m2/d CIV, D1-4 q28 Days x 3 Cycles TREATMENT RESPONSE: Progressive Disease (PD)  START OFF PATHWAY REGIMEN - Head and Neck   [id number]:Cisplatin 80 mg/m2 IV D1 + Gemcitabine 1,000 mg/m2 IV D1,8 q21 Days x 3 Cycles:   A cycle is every 21 days:     Gemcitabine      Cisplatin   **Always confirm dose/schedule in your pharmacy ordering system**  Patient Characteristics: Nasopharyngeal, Metastatic, First Line Disease Classification: Nasopharyngeal Current Disease Status: Metastatic Disease AJCC T Category: [place] AJCC N Category: NX AJCC M Category: M1 AJCC 8 Stage Grouping: IVB Line of Therapy: First Line  Intent of Therapy: Non-Curative / Palliative Intent, Discussed with Patient
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
## 2019-02-13 NOTE — Patient Instructions (Addendum)
[place] at [facility] Discharge Instructions  You were seen today by Dr. Delton Coombes. He went over your recent biopsy results. Your cancer has spread from your neck to your liver. He would like you to change chemotherapy to treat your cancer. Your cancer is no longer curable, we can treat your cancer but not cure it. He will see you back in 1 week for labs and follow up.   Thank you for choosing [place] at [facility] to provide your oncology and hematology care.  To afford each patient quality time with our provider, please arrive at least 15 minutes before your scheduled appointment time.   If you have a lab appointment with the [facility] please come in thru the  Main Entrance and check in at the main information desk  You need to re-schedule your appointment should you arrive 10 or more minutes late.  We strive to give you quality time with our providers, and arriving late affects you and other patients whose appointments are after yours.  Also, if you no show three or more times for appointments you may be dismissed from the clinic at the providers discretion.     Again, thank you for choosing [facility].  Our hope is that these requests will decrease the amount of time that you wait before being seen by our physicians.       _____________________________________________________________  Should you have questions after your visit to [facility], please contact our office at [phone] between the hours of 8:00 a.m. and 4:30 p.m.  Voicemails left after 4:00 p.m. will not be returned until the following business day.  For prescription refill requests, have your pharmacy contact our office and allow 72 hours.    Cancer Center Support Programs:   > Cancer Support Group  2nd Tuesday of the month 1pm-2pm, Journey Room

## 2019-02-19 ENCOUNTER — Encounter (HOSPITAL_COMMUNITY): Payer: Self-pay

## 2019-02-19 DIAGNOSIS — Z95828 Presence of other vascular implants and grafts: Secondary | ICD-10-CM | POA: Insufficient documentation

## 2019-02-19 HISTORY — DX: Presence of other vascular implants and grafts: Z95.828

## 2019-02-19 MED ORDER — LIDOCAINE-PRILOCAINE 2.5-2.5 % EX CREA: TOPICAL_CREAM | CUTANEOUS | 3 refills | Status: DC

## 2019-02-19 MED ORDER — PROCHLORPERAZINE MALEATE 10 MG PO TABS: 10.0000 mg | ORAL_TABLET | Freq: Four times a day (QID) | ORAL | 1 refills | Status: DC | PRN

## 2019-02-19 NOTE — Patient Instructions (Signed)
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 You will be treated on Day 1 and Day 8 every 21 days (this means you will come two weeks in a row for treatment, then have one week off prior to your next cycle). The chemotherapy medications you will be treated with are cisplatin and gemcitabine (Gemzar). The intent of this treatment is palliative - this means you will be treated with the intent to control your cancer and help alleviate any symptoms you may be experiencing related to the cancer.  You will see the doctor regularly throughout treatment.  We monitor your lab work prior to every treatment. The doctor monitors your response to treatment by the way you are feeling, your blood work, and scans periodically. There will be wait times while you are here for treatment.  It will take about 30 minutes to 1 hour for your lab work to result.  Then there will be wait times while pharmacy mixes your medications.   Medications you will receive in the clinic prior to your chemotherapy medications:  Aloxi:  ALOXI is a medicine called an "antiemetic."   ALOXI is used in adults to help prevent the  nausea and vomiting that happens with certain anti-cancer medicines (chemotherapy).  Aloxi is a long acting medication, and will remain in your system for 24-36 hours.   Emend:  This is an anti-nausea medication that is used with Aloxi to help prevent nausea and vomiting caused by chemotherapy.  Dexamethasone:  This is a steroid given prior to chemotherapy to help prevent allergic reactions; it may also help prevent and control nausea and diarrhea.   CISPLATIN  About This Drug Cisplatin is a drug used to treat cancer. This drug is given in the vein (IV).  This will take 1 hour to infuse.  With this drug you will receive 2 hours of IV hydration prior to administration and 1 hour of IV hydration after administration.   This is to help protect your kidneys.  You will have to urinate 200 mL prior to receiving this medication.  We will give you something to measure your urine in.   Possible Side Effects (More Common) . This drug may affect how your kidneys work. Your kidney function will be checked as needed. . Electrolyte changes. Your blood will be checked for electrolyte changes as needed. . High-frequency hearing loss may occur. You will get IV fluids before and during the Cisplatin infusion to help prevent this. You may also get ringing in the ears. . Bone marrow depression. This is a decrease in the number of white blood cells, red blood cells, and platelets. This may raise your risk of infection, make you tired and weak (fatigue), and raise your risk of bleeding. . Nausea and throwing up (vomiting). These symptoms may happen within a few hours after your treatment and may last for a few days to a week. Medicines are available to stop or lessen these side effects.  Possible Side Effects (Less Common) . Effects on the nerves are called peripheral neuropathy. You may feel numbness or pain in your hands and feet. It may be hard for you to button your clothes, open jars, or walk as usual. The effect on the nerves may get worse with more doses of the drug. These effects get better in some people after the drug is stopped, but it does not get better in all people. Marland Kitchen  Blurred vision or other changes in eyesight. . Soreness of the mouth and throat. You may have red areas, white patches, or sores that hurt. . Hair loss. You may notice your hair getting thin. Some patients lose their hair. Your hair often grows back when treatment is done.  Allergic Reactions Allergic reactions to this drug are rare, but may happen in some patients. Signs of allergic reactions to this drug may be a rash, fever, chills, feeling dizzy, trouble breathing, and/or feeling that your heart is beating in a fast or not normal way.  Treating Side  Effects . Drink 6-8 cups of fluids each day unless your doctor has told you to limit your fluid intake due to some other health problem. A cup is 8 ounces of fluid. If you throw up or have loose bowel movements you should drink more fluids so that you do not become dehydrated (lack water in the body due to losing too much fluid). . If you have numbness and tingling in your hands and feet, be careful when cooking, walking, and handling sharp objects and hot liquids. . Mouth care is very important. Your mouth care should consist of routine, gentle cleaning of your teeth or dentures and rinsing your mouth with a mixture of 1/2 teaspoon of salt in 8 ounces of water or  teaspoon of baking soda in 8 ounces of water. This should be done at least after each meal and at bedtime. . If you have mouth sores, avoid mouthwash that has alcohol. Also avoid alcohol and smoking because they can bother your mouth and throat. . Talk with your nurse about getting a wig before you lose your hair. Also, call the [place] at 800-ACS-2345 to find out information about the "Look Good, Feel Better" program close to where you live. It is a free program where women getting chemotherapy can learn about wigs, turbans and scarves as well as makeup techniques and skin and nail care.  Food and Drug Interactions  There are no known interactions of Cisplatin with food. This drug may interact with other medicines. Tell your doctor and pharmacist about all the medicines and dietary supplements (vitamins, minerals, herbs and others) that you are taking at this time. The safety and use of dietary supplements and alternative diets are often not known. Using these might affect your cancer or interfere with your treatment. Until more is known, you should not use dietary supplements or alternative diets without your cancer doctor's help.  When to Call the Doctor  Call your doctor or nurse right away if you have any of these  symptoms: . Rash or itching . Feeling dizzy or lightheaded . Wheezing or trouble breathing . Swelling of the face . Fever of 100.5 F (38 C) or above . Chills . Easy bleeding or bruising . Decreased urine . Weight gain of 5 pounds in one week (fluid retention) . Nausea that stops you from eating or drinking . Throwing up more than 3 times a day Call your doctor or nurse as soon as possible if you have these symptoms: . Numbness, tingling, decreased feeling or weakness in fingers, toes, arms, or legs . Trouble walking or changes in the way you walk, feeling clumsy when buttoning clothes, opening jars, or other routine hand motions . Blurred vision or other changes in eyesight . Changes in hearing, ringing in the ears . Pain in your mouth or throat that makes it hard to eat or drink . Fatigue that interferes with  your daily activities  Sexual Problems and Reproductive Concerns  . Infertility warning: Sexual problems and reproduction concerns may occur. In both men and women, this drug may affect your ability to have children. This cannot be determined before your treatment. Speak with your doctor or nurse if you plan to have children. Ask for information on sperm or egg banking. . In men, this drug may interfere with your ability to make sperm, but it should not change your ability to have sexual relations. . In women, menstrual bleeding may become irregular or stop while you are receiving this drug. Do not assume that you cannot become pregnant if you do not have a menstrual period. . Women may experience signs of menopause like vaginal dryness, itching, and pain during sexual relations . Genetic counseling is available for you to talk about the effects of this drug therapy on future pregnancies. Also, a genetic counselor can look at the possible risk of problems in the unborn baby due to this medicine if an exposure happens during pregnancy.  Gemcitabine (Gemzar)  About This  Drug Gemcitabine is used to treat cancer. It is given in the vein (IV).  Possible Side Effects . Bone marrow suppression. This is a decrease in the number of white blood cells, red blood cells, and platelets. This may raise your risk of infection, make you tired and weak (fatigue), and raise your risk of bleeding . Fever . Trouble breathing . Nausea and throwing up (vomiting) . Changes in your liver function . Increased protein in your urine, which can affect how your kidneys work . Blood in your urine . Rash . Swelling of your legs, ankles and/or feet  Note: Each of the side effects above was reported in 20% or greater of patients treated with Gemcitabine. Not all possible side effects are included above.  Warnings and Precautions . Severe bone marrow suppression . Inflammation (swelling) of the lungs and/or thickening of the lung tissues, which may be lifethreatening. You may have a dry cough or trouble breathing. . Changes in your kidney function, which can cause kidney failure . Changes in your liver function, which can cause liver failure and may be life-threatening . If you have received radiation treatments, your skin may become red and/or you may develop soreness of the mouth and throat after gemcitabine. This reaction is called "recall." Your body is recalling, or remembering, that it had radiation therapy. . A syndrome where fluid from your veins can leak into your tissues and cause a decrease in your blood pressure and fluid to accumulate in your tissues and/or lungs. . A syndrome can occur that causes changes to kidney and liver function in combination with a decrease in red blood cells. Kidney failure may result which may be life-threatening. . Changes in your central nervous system can happen. The central nervous system is made up of your brain and spinal cord. You could feel extreme tiredness, agitation, confusion, hallucinations (see or hear things that are not  there), have trouble understanding or speaking, loss of control of your bowels or bladder, eyesight changes, numbness or lack of strength to your arms, legs, face, or body, seizures or coma. If you start to have any of these symptoms let your doctor know right away.  Note: Some of the side effects above are very rare. If you have concerns and/or questions, please discuss them with your medical team.  Important Information . This drug may be present in the saliva, tears, sweat, urine, stool, vomit, semen,  and vaginal secretions. Talk to your doctor and/or your nurse about the necessary precautions to take during this time.  Treating Side Effects . Manage tiredness by pacing your activities for the day. . Be sure to include periods of rest between energy-draining activities. . To decrease the risk of infection, wash your hands regularly. . Avoid close contact with people who have a cold, the flu, or other infections. . Take your temperature as your doctor or nurse tells you, and whenever you feel like you may have a fever. . To help decrease bleeding, use a soft toothbrush. Check with your nurse before using dental floss. . Be very careful when using knives or tools. . Use an electric shaver instead of a razor. . Drink plenty of fluids (a minimum of eight glasses per day is recommended). . If you throw up or have loose bowel movements, you should drink more fluids so that you do not become dehydrated (lack of water in the body from losing too much fluid). . To help with nausea and vomiting, eat small, frequent meals instead of three large meals a day. Choose foods and drinks that are at room temperature. Ask your nurse or doctor about other helpful tips and medicine that is available to help stop or lessen these symptoms. . If you get a rash do not put anything on it unless your doctor or nurse says you may. Keep the area around the rash clean and dry. Ask your doctor for medicine if your  rash bothers you. . If you received radiation, and your skin becomes red or irritated again, or you develop soreness of the mouth and throat, follow the same care instructions you did during radiation treatment. Be sure to tell the nurse or doctor administering your chemotherapy about your skin changes.  Food and Drug Interactions . There are no known interactions of gemcitabine with food. . This drug may interact with other medicines. Tell your doctor and pharmacist about all the prescription and over-the-counter medicines and dietary supplements (vitamins, minerals, herbs and others) that you are taking at this time. Also, check with your doctor or pharmacist before starting any new prescription or over-the-counter medicines, or dietary supplements to make sure that there are no interactions.  When to Call the Doctor Call your doctor or nurse if you have any of these symptoms and/or any new or unusual symptoms: . Fever of 100.4 F (38 C) or higher . Chills . Tiredness that interferes with your daily activities . Feeling dizzy or lightheaded . Pain in your chest . Dry cough . Wheezing and/or trouble breathing . Confusion and/or agitation . Symptoms of a seizure such as confusion, blacking out, passing out, loss of hearing or vision, blurred vision, unusual smells or tastes (such as burning rubber), trouble talking, tremors or shaking in parts or all of the body, repeated body movements, tense muscles that do not relax, and loss of control of urine and bowels. If you or your family member suspects you are having a seizure, call 911 right away. . Hallucinations . Trouble understanding or speaking . Blurry vision or changes in your eyesight . Numbness or lack of strength to your arms, legs, face, or body . Easy bleeding or bruising . Nausea that stops you from eating or drinking and/or is not relieved by prescribed medicines . Throwing up more than 3 times a day . Swelling of legs,  ankles, or feet . Weight gain of 5 pounds in one week (fluid retention) . Blood  in urine . Decreased urine or very dark urine . Foamy or bubbly-looking urine . A new rash/itching or a rash that is not relieved by prescribed medicines . Signs of possible liver problems: dark urine, pale bowel movements, bad stomach pain, feeling very tired and weak, unusual itching, or yellowing of the eyes or skin . If you think you may be pregnant or may have impregnated your partner  Reproduction Warnings . Pregnancy warning: This drug can have harmful effects on the unborn baby. Women of childbearing potential should use effective methods of birth control during your cancer treatment and for 6 months after treatment. Men with male partners of childbearing potential should use effective methods of birth control during your cancer treatment and for 3 months after your cancer treatment. Let your doctor know right away if you think you may be pregnant or may have impregnated your partner.  . Breastfeeding warning: Women should not breastfeed during treatment and for 1 week after treatment because this drug could enter the breast milk and cause harm to a breastfeeding baby.  . Fertility warning: In men, this drug may affect your ability to have children in the future. Talk with your doctor or nurse if you plan to have children. Ask for information on sperm banking.   SELF CARE ACTIVITIES WHILE ON CHEMOTHERAPY:  Hydration Increase your fluid intake 48 hours prior to treatment and drink at least 8 to 12 cups (64 ounces) of water/decaffeinated beverages per day after treatment. You can still have your cup of coffee or soda but these beverages do not count as part of your 8 to 12 cups that you need to drink daily. No alcohol intake.  Medications Continue taking your normal prescription medication as prescribed.  If you start any new herbal or new supplements please let us know first to make sure it is  safe.  Mouth Care Have teeth cleaned professionally before starting treatment. Keep dentures and partial plates clean. Use soft toothbrush and do not use mouthwashes that contain alcohol. Biotene is a good mouthwash that is available at most pharmacies or may be ordered by calling [phone]. Use warm salt water gargles (1 teaspoon salt per 1 quart warm water) before and after meals and at bedtime. Or you may rinse with 2 tablespoons of three-percent hydrogen peroxide mixed in eight ounces of water. If you are still having problems with your mouth or sores in your mouth please call the clinic. If you need dental work, please let the doctor know before you go for your appointment so that we can coordinate the best possible time for you in regards to your chemo regimen. You need to also let your dentist know that you are actively taking chemo. We may need to do labs prior to your dental appointment.  Skin Care Always use sunscreen that has not expired and with SPF (Sun Protection Factor) of 50 or higher. Wear hats to protect your head from the sun. Remember to use sunscreen on your hands, ears, face, & feet.  Use good moisturizing lotions such as udder cream, eucerin, or even Vaseline. Some chemotherapies can cause dry skin, color changes in your skin and nails.    . Avoid long, hot showers or baths. . Use gentle, fragrance-free soaps and laundry detergent. . Use moisturizers, preferably creams or ointments rather than lotions because the thicker consistency is better at preventing skin dehydration. Apply the cream or ointment within 15 minutes of showering. Reapply moisturizer at night, and moisturize your  hands every time after you wash them.  Hair Loss (if your doctor says your hair will fall out)  . If your doctor says that your hair is likely to fall out, decide before you begin chemo whether you want to wear a wig. You may want to shop before treatment to match your hair color. . Hats,  turbans, and scarves can also camouflage hair loss, although some people prefer to leave their heads uncovered. If you go bare-headed outdoors, be sure to use sunscreen on your scalp. . Cut your hair short. It eases the inconvenience of shedding lots of hair, but it also can reduce the emotional impact of watching your hair fall out. . Don't perm or color your hair during chemotherapy. Those chemical treatments are already damaging to hair and can enhance hair loss. Once your chemo treatments are done and your hair has grown back, it's OK to resume dyeing or perming hair.  With chemotherapy, hair loss is almost always temporary. But when it grows back, it may be a different color or texture. In older adults who still had hair color before chemotherapy, the new growth may be completely gray.  Often, new hair is very fine and soft.  Infection Prevention Please wash your hands for at least 30 seconds using warm soapy water. Handwashing is the #1 way to prevent the spread of germs. Stay away from sick people or people who are getting over a cold. If you develop respiratory systems such as green/yellow mucus production or productive cough or persistent cough let us know and we will see if you need an antibiotic. It is a good idea to keep a pair of gloves on when going into grocery stores/[employer] to decrease your risk of coming into contact with germs on the carts, etc. Carry alcohol hand gel with you at all times and use it frequently if out in public. If your temperature reaches 100.5 or higher please call the clinic and let us know.  If it is after hours or on the weekend please go to the ER if your temperature is over 100.5.  Please have your own personal thermometer at home to use.    Sex and bodily fluids If you are going to have sex, a condom must be used to protect the person that isn't taking chemotherapy. Chemo can decrease your libido (sex drive). For a few days after chemotherapy, chemotherapy can be  excreted through your bodily fluids.  When using the toilet please close the lid and flush the toilet twice.  Do this for a few day after you have had chemotherapy.   Effects of chemotherapy on your sex life Some changes are simple and won't last long. They won't affect your sex life permanently.  Sometimes you may feel: . too tired . not strong enough to be very active . sick or sore  . not in the mood . anxious or low Your anxiety might not seem related to sex. For example, you may be worried about the cancer and how your treatment is going. Or you may be worried about money, or about how you family are coping with your illness. These things can cause stress, which can affect your interest in sex. It's important to talk to your partner about how you feel. Remember - the changes to your sex life don't usually last long. There's usually no medical reason to stop having sex during chemo. The drugs won't have any long term physical effects on your performance or enjoyment  of sex. Cancer can't be passed on to your partner during sex  Contraception It's important to use reliable contraception during treatment. Avoid getting pregnant while you or your partner are having chemotherapy. This is because the drugs may harm the baby. Sometimes chemotherapy drugs can leave a man or woman infertile.  This means you would not be able to have children in the future. You might want to talk to someone about permanent infertility. It can be very difficult to learn that you may no longer be able to have children. Some people find counselling helpful. There might be ways to preserve your fertility, although this is easier for men than for women. You may want to speak to a fertility expert. You can talk about sperm banking or harvesting your eggs. You can also ask about other fertility options, such as donor eggs. If you have or have had breast cancer, your doctor might advise you not to take the contraceptive pill.  This is because the hormones in it might affect the cancer.  It is not known for sure whether or not chemotherapy drugs can be passed on through semen or secretions from the vagina. Because of this some doctors advise people to use a barrier method if you have sex during treatment. This applies to vaginal, anal or oral sex. Generally, doctors advise a barrier method only for the time you are actually having the treatment and for about a week after your treatment. Advice like this can be worrying, but this does not mean that you have to avoid being intimate with your partner. You can still have close contact with your partner and continue to enjoy sex.  Animals If you have cats or birds we just ask that you not change the litter or change the cage.  Please have someone else do this for you while you are on chemotherapy.   Food Safety During and After Cancer Treatment Food safety is important for people both during and after cancer treatment. Cancer and cancer treatments, such as chemotherapy, radiation therapy, and stem cell/bone marrow transplantation, often weaken the immune system. This makes it harder for your body to protect itself from foodborne illness, also called food poisoning. Foodborne illness is caused by eating food that contains harmful bacteria, parasites, or viruses.  Foods to avoid Some foods have a higher risk of becoming tainted with bacteria. These include: Marland Kitchen Unwashed fresh fruit and vegetables, especially leafy vegetables that can hide dirt and other contaminants . Raw sprouts, such as alfalfa sprouts . Raw or undercooked beef, especially ground beef, or other raw or undercooked meat and poultry . Fatty, fried, or spicy foods immediately before or after treatment.  These can sit heavy on your stomach and make you feel nauseous. . Raw or undercooked shellfish, such as oysters. . Sushi and sashimi, which often contain raw fish.  . Unpasteurized beverages, such as unpasteurized  fruit juices, raw milk, raw yogurt, or cider . Undercooked eggs, such as soft boiled, over easy, and poached; raw, unpasteurized eggs; or foods made with raw egg, such as homemade raw cookie dough and homemade mayonnaise  Simple steps for food safety  Shop smart. . Do not buy food stored or displayed in an unclean area. . Do not buy bruised or damaged fruits or vegetables. . Do not buy cans that have cracks, dents, or bulges. . Pick up foods that can spoil at the end of your shopping trip and store them in a cooler on the way home.  Prepare and clean up foods carefully. . Rinse all fresh fruits and vegetables under running water, and dry them with a clean towel or paper towel. . Clean the top of cans before opening them. . After preparing food, wash your hands for 20 seconds with hot water and soap. Pay special attention to areas between fingers and under nails. . Clean your utensils and dishes with hot water and soap. Marland Kitchen Disinfect your kitchen and cutting boards using 1 teaspoon of liquid, unscented bleach mixed into 1 quart of water.    Dispose of old food. . Eat canned and packaged food before its expiration date (the "use by" or "best before" date). . Consume refrigerated leftovers within 3 to 4 days. After that time, throw out the food. Even if the food does not smell or look spoiled, it still may be unsafe. Some bacteria, such as Listeria, can grow even on foods stored in the refrigerator if they are kept for too long.  Take precautions when eating out. . At restaurants, avoid buffets and salad bars where food sits out for a long time and comes in contact with many people. Food can become contaminated when someone with a virus, often a norovirus, or another "bug" handles it. . Put any leftover food in a "to-go" container yourself, rather than having the server do it. And, refrigerate leftovers as soon as you get home. . Choose restaurants that are clean and that are willing to prepare  your food as you order it cooked.   MEDICATIONS:                                                                                                                                                                Compazine/Prochlorperazine 10mg  tablet. Take 1 tablet every 6 hours as needed for nausea/vomiting. (This can make you sleepy)   EMLA cream. Apply a quarter size amount to port site 1 hour prior to chemo. Do not rub in. Cover with plastic wrap.   Over-the-Counter Meds:  Colace - 100 mg capsules - take 2 capsules daily.  If this doesn't help then you can increase to 2 capsules twice daily.  Call us if this does not help your bowels move.   Imodium 2mg  capsule. Take 2 capsules after the 1st loose stool and then 1 capsule every 2 hours until you go a total of 12 hours without having a loose stool. Call the [facility] if loose stools continue. If diarrhea occurs at bedtime, take 2 capsules at bedtime. Then take 2 capsules every 4 hours until morning. Call [facility].    Diarrhea Sheet   If you are having loose stools/diarrhea, please purchase Imodium and begin taking as outlined:  At the first sign of poorly formed or loose stools you  should begin taking Imodium (loperamide) 2 mg capsules.  Take two tablets (4mg ) followed by one tablet (2mg ) every 2 hours - DO NOT EXCEED 8 tablets in 24 hours.  If it is bedtime and you are having loose stools, take 2 tablets at bedtime, then 2 tablets every 4 hours until morning.   Always call the [facility] if you are having loose stools/diarrhea that you can't get under control.  Loose stools/diarrhea leads to dehydration (loss of water) in your body.  We have other options of trying to get the loose stools/diarrhea to stop but you must let us know!   Constipation Sheet  Colace - 100 mg capsules - take 2 capsules daily.  If this doesn't help then you can increase to 2 capsules twice daily.  Please call if the above does not work for you.    Do not go more than 2 days without a bowel movement.  It is very important that you do not become constipated.  It will make you feel sick to your stomach (nausea) and can cause abdominal pain and vomiting.   Nausea Sheet   Compazine/Prochlorperazine 10mg  tablet. Take 1 tablet every 6 hours as needed for nausea/vomiting. (This can make you sleepy)  If you are having persistent nausea (nausea that does not stop) please call the [facility] and let us know the amount of nausea that you are experiencing.  If you begin to vomit, you need to call the [facility] and if it is the weekend and you have vomited more than one time and can't get it to stop-go to the Emergency Room.  Persistent nausea/vomiting can lead to dehydration (loss of fluid in your body) and will make you feel terrible.   Ice chips, sips of clear liquids, foods that are @ room temperature, crackers, and toast tend to be better tolerated.   SYMPTOMS TO REPORT AS SOON AS POSSIBLE AFTER TREATMENT:   FEVER GREATER THAN 100.5 F  CHILLS WITH OR WITHOUT FEVER  NAUSEA AND VOMITING THAT IS NOT CONTROLLED WITH YOUR NAUSEA MEDICATION  UNUSUAL SHORTNESS OF BREATH  UNUSUAL BRUISING OR BLEEDING  TENDERNESS IN MOUTH AND THROAT WITH OR WITHOUT PRESENCE OF ULCERS  URINARY PROBLEMS  BOWEL PROBLEMS  UNUSUAL RASH      Wear comfortable clothing and clothing appropriate for easy access to any Portacath or PICC line. Let us know if there is anything that we can do to make your therapy better!    What to do if you need assistance after hours or on the weekends: CALL [phone].  HOLD on the line, do not hang up.  You will hear multiple messages but at the end you will be connected with a nurse triage line.  They will contact the doctor if necessary.  Most of the time they will be able to assist you.  Do not call the hospital operator.      I have been informed and understand all of the instructions given to me and have  received a copy. I have been instructed to call the clinic [phone] or my family physician as soon as possible for continued medical care, if indicated. I do not have any more questions at this time but understand that I may call the [facility] or the Patient Navigator at [phone] during office hours should I have questions or need assistance in obtaining follow-up care.

## 2019-02-20 ENCOUNTER — Inpatient Hospital Stay (HOSPITAL_BASED_OUTPATIENT_CLINIC_OR_DEPARTMENT_OTHER): Payer: Medicare Other | Admitting: Hematology

## 2019-02-20 ENCOUNTER — Inpatient Hospital Stay (HOSPITAL_COMMUNITY): Payer: Medicare Other

## 2019-02-20 ENCOUNTER — Encounter (HOSPITAL_COMMUNITY): Payer: Self-pay | Admitting: Hematology

## 2019-02-20 ENCOUNTER — Other Ambulatory Visit: Payer: Self-pay

## 2019-02-20 VITALS — BP 133/52 | HR 100 | Temp 97.8°F | Resp 18

## 2019-02-20 DIAGNOSIS — C119 Malignant neoplasm of nasopharynx, unspecified: Secondary | ICD-10-CM

## 2019-02-20 DIAGNOSIS — J449 Chronic obstructive pulmonary disease, unspecified: Secondary | ICD-10-CM | POA: Diagnosis not present

## 2019-02-20 DIAGNOSIS — C770 Secondary and unspecified malignant neoplasm of lymph nodes of head, face and neck: Secondary | ICD-10-CM | POA: Diagnosis not present

## 2019-02-20 DIAGNOSIS — Z95828 Presence of other vascular implants and grafts: Secondary | ICD-10-CM

## 2019-02-20 DIAGNOSIS — F17210 Nicotine dependence, cigarettes, uncomplicated: Secondary | ICD-10-CM

## 2019-02-20 DIAGNOSIS — M542 Cervicalgia: Secondary | ICD-10-CM | POA: Diagnosis not present

## 2019-02-20 DIAGNOSIS — Z5111 Encounter for antineoplastic chemotherapy: Secondary | ICD-10-CM | POA: Diagnosis not present

## 2019-02-20 LAB — CBC WITH DIFFERENTIAL/PLATELET
Abs Immature Granulocytes: 0.07 10*3/uL (ref 0.00–0.07)
Basophils Absolute: 0.1 10*3/uL (ref 0.0–0.1)
Basophils Relative: 0 %
Eosinophils Absolute: 0.3 10*3/uL (ref 0.0–0.5)
Eosinophils Relative: 2 %
HCT: 37.5 % — ABNORMAL LOW (ref 39.0–52.0)
Hemoglobin: 12.4 g/dL — ABNORMAL LOW (ref 13.0–17.0)
Immature Granulocytes: 1 %
Lymphocytes Relative: 27 %
Lymphs Abs: 3.7 10*3/uL (ref 0.7–4.0)
MCH: 32 pg (ref 26.0–34.0)
MCHC: 33.1 g/dL (ref 30.0–36.0)
MCV: 96.9 fL (ref 80.0–100.0)
Monocytes Absolute: 1.1 10*3/uL — ABNORMAL HIGH (ref 0.1–1.0)
Monocytes Relative: 8 %
Neutro Abs: 8.5 10*3/uL — ABNORMAL HIGH (ref 1.7–7.7)
Neutrophils Relative %: 62 %
Platelets: 705 10*3/uL — ABNORMAL HIGH (ref 150–400)
RBC: 3.87 MIL/uL — ABNORMAL LOW (ref 4.22–5.81)
RDW: 16.7 % — ABNORMAL HIGH (ref 11.5–15.5)
WBC: 13.8 10*3/uL — ABNORMAL HIGH (ref 4.0–10.5)
nRBC: 0 % (ref 0.0–0.2)

## 2019-02-20 LAB — COMPREHENSIVE METABOLIC PANEL
ALT: 9 U/L (ref 0–44)
AST: 18 U/L (ref 15–41)
Albumin: 3.3 g/dL — ABNORMAL LOW (ref 3.5–5.0)
Alkaline Phosphatase: 134 U/L — ABNORMAL HIGH (ref 38–126)
Anion gap: 14 (ref 5–15)
BUN: 19 mg/dL (ref 6–20)
CO2: 19 mmol/L — ABNORMAL LOW (ref 22–32)
Calcium: 9.1 mg/dL (ref 8.9–10.3)
Chloride: 107 mmol/L (ref 98–111)
Creatinine, Ser: 1.23 mg/dL (ref 0.61–1.24)
GFR calc Af Amer: >60 mL/min (ref >60)
GFR calc non Af Amer: >60 mL/min (ref >60)
Glucose, Bld: 125 mg/dL — ABNORMAL HIGH (ref 70–99)
Potassium: 3.6 mmol/L (ref 3.5–5.1)
Sodium: 140 mmol/L (ref 135–145)
Total Bilirubin: <0.1 mg/dL — ABNORMAL LOW (ref 0.3–1.2)
Total Protein: 7.8 g/dL (ref 6.5–8.1)

## 2019-02-20 LAB — MAGNESIUM: Magnesium: 2 mg/dL (ref 1.7–2.4)

## 2019-02-20 MED ORDER — POTASSIUM CHLORIDE 2 MEQ/ML IV SOLN
Freq: Once | INTRAVENOUS | Status: AC
  Administered 2019-02-20: 09:00:00 via INTRAVENOUS
  Filled 2019-02-20: qty 10

## 2019-02-20 MED ORDER — SODIUM CHLORIDE 0.9 % IV SOLN
Freq: Once | INTRAVENOUS | Status: AC
  Administered 2019-02-20: 09:00:00 via INTRAVENOUS

## 2019-02-20 MED ORDER — HEPARIN SOD (PORK) LOCK FLUSH 100 UNIT/ML IV SOLN
500.0000 [IU] | Freq: Once | INTRAVENOUS | Status: AC | PRN
  Administered 2019-02-20: 14:00:00 500 [IU]

## 2019-02-20 MED ORDER — SODIUM CHLORIDE 0.9 % IV SOLN
80.0000 mg/m2 | Freq: Once | INTRAVENOUS | Status: AC
  Administered 2019-02-20: 165 mg via INTRAVENOUS
  Filled 2019-02-20: qty 165

## 2019-02-20 MED ORDER — SODIUM CHLORIDE 0.9% FLUSH
10.0000 mL | INTRAVENOUS | Status: DC | PRN
  Administered 2019-02-20: 08:00:00 10 mL
  Filled 2019-02-20: qty 10

## 2019-02-20 MED ORDER — SODIUM CHLORIDE 0.9 % IV SOLN
2000.0000 mg | Freq: Once | INTRAVENOUS | Status: AC
  Administered 2019-02-20: 2000 mg via INTRAVENOUS
  Filled 2019-02-20: qty 52.6

## 2019-02-20 MED ORDER — PALONOSETRON HCL INJECTION 0.25 MG/5ML
0.2500 mg | Freq: Once | INTRAVENOUS | Status: AC
  Administered 2019-02-20: 11:00:00 0.25 mg via INTRAVENOUS
  Filled 2019-02-20: qty 5

## 2019-02-20 MED ORDER — SODIUM CHLORIDE 0.9 % IV SOLN
Freq: Once | INTRAVENOUS | Status: AC
  Administered 2019-02-20: 11:00:00 via INTRAVENOUS
  Filled 2019-02-20: qty 5

## 2019-02-20 NOTE — Patient Instructions (Addendum)
[facility] at [facility] Discharge Instructions  You were seen today by Dr. Katragadda. He went over your recent lab results. He will see you back in 1 week for labs, treatment and follow up.   Thank you for choosing [facility] at [facility] to provide your oncology and hematology care.  To afford each patient quality time with our provider, please arrive at least 15 minutes before your scheduled appointment time.   If you have a lab appointment with the Cancer Center please come in thru the  Main Entrance and check in at the main information desk  You need to re-schedule your appointment should you arrive 10 or more minutes late.  We strive to give you quality time with our providers, and arriving late affects you and other patients whose appointments are after yours.  Also, if you no show three or more times for appointments you may be dismissed from the clinic at the providers discretion.     Again, thank you for choosing [facility].  Our hope is that these requests will decrease the amount of time that you wait before being seen by our physicians.       _____________________________________________________________  Should you have questions after your visit to [facility], please contact our office at [phone] between the hours of 8:00 a.m. and 4:30 p.m.  Voicemails left after 4:00 p.m. will not be returned until the following business day.  For prescription refill requests, have your pharmacy contact our office and allow 72 hours.    Cancer Center Support Programs:   > Cancer Support Group  2nd Tuesday of the month 1pm-2pm, Journey Room    

## 2019-02-20 NOTE — Progress Notes (Signed)
Patient to treatment area for chemotherapy.  Consent signed with written information given and reviewed.  All questions asked and answered.    Patient seen by Dr. Delton Coombes with lab review and ok to treat today verbal order.   Patient tolerated chemotherapy with no complaints voiced.  Port site clean and dry with no bruising or swelling noted at site.  Good blood return noted before and after administration of chemotherapy.  Left accessed for tomorrows hydration.   Patient left ambulatory with VSS and no s/s of distress noted.
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
## 2019-02-21 ENCOUNTER — Inpatient Hospital Stay (HOSPITAL_COMMUNITY): Payer: Medicare Other

## 2019-02-21 ENCOUNTER — Other Ambulatory Visit: Payer: Self-pay

## 2019-02-21 VITALS — BP 115/69 | HR 77 | Temp 97.6°F | Resp 17

## 2019-02-21 DIAGNOSIS — Z5111 Encounter for antineoplastic chemotherapy: Secondary | ICD-10-CM | POA: Diagnosis not present

## 2019-02-21 DIAGNOSIS — C119 Malignant neoplasm of nasopharynx, unspecified: Secondary | ICD-10-CM

## 2019-02-21 MED ORDER — ONDANSETRON HCL 4 MG/2ML IJ SOLN
INTRAMUSCULAR | Status: AC
  Filled 2019-02-21: qty 4

## 2019-02-21 MED ORDER — SODIUM CHLORIDE 0.9 % IV SOLN
Freq: Once | INTRAVENOUS | Status: AC
  Administered 2019-02-21: 13:00:00 via INTRAVENOUS
  Filled 2019-02-21: qty 1000

## 2019-02-21 MED ORDER — HEPARIN SOD (PORK) LOCK FLUSH 100 UNIT/ML IV SOLN
500.0000 [IU] | Freq: Once | INTRAVENOUS | Status: AC | PRN
  Administered 2019-02-21: 500 [IU]

## 2019-02-21 MED ORDER — ONDANSETRON HCL 4 MG/2ML IJ SOLN
8.0000 mg | Freq: Once | INTRAMUSCULAR | Status: AC
  Administered 2019-02-21: 13:00:00 8 mg via INTRAVENOUS

## 2019-02-21 MED ORDER — SODIUM CHLORIDE 0.9% FLUSH
10.0000 mL | Freq: Once | INTRAVENOUS | Status: AC | PRN
  Administered 2019-02-21: 10 mL

## 2019-02-21 NOTE — Progress Notes (Signed)
Pt presents today for Hydration fluids. Zofran 8mg  administered IV. VSS. Pt complains of heart burn since yesterday's treatment.   Hydration fluids given today per MD orders. Tolerated infusion without adverse affects. Vital signs stable. No complaints at this time. Discharged from clinic ambulatory. F/U with [facility] as scheduled.

## 2019-02-21 NOTE — Patient Instructions (Signed)
[facility]  Discharge Instructions:   _______________________________________________________________  Thank you for choosing [facility] at [facility] to provide your oncology and hematology care.  To afford each patient quality time with our providers, please arrive at least 15 minutes before your scheduled appointment.  You need to re-schedule your appointment if you arrive 10 or more minutes late.  We strive to give you quality time with our providers, and arriving late affects you and other patients whose appointments are after yours.  Also, if you no show three or more times for appointments you may be dismissed from the clinic.  Again, thank you for choosing [facility]. Our hope is that these requests will allow you access to exceptional care and in a timely manner. _______________________________________________________________  If you have questions after your visit, please contact our office at [phone] between the hours of 8:30 a.m. and 5:00 p.m. Voicemails left after 4:30 p.m. will not be returned until the following business day. _______________________________________________________________  For prescription refill requests, have your pharmacy contact our office. _______________________________________________________________  Recommendations made by the consultant and any test results will be sent to your referring physician. _______________________________________________________________ 

## 2019-02-21 NOTE — Progress Notes (Signed)
Patient here today for IVF, patient complaining of nausea. Patient stated he took 2 compazine pill this morning and stated they did not work. Informed MD, will give zofran 8mg  today per orders. Will reassess tomorrow if he is still nauseated and will let MD know.

## 2019-02-22 ENCOUNTER — Inpatient Hospital Stay (HOSPITAL_COMMUNITY): Payer: Medicare Other

## 2019-02-22 VITALS — BP 121/70 | HR 110 | Temp 98.0°F | Resp 18

## 2019-02-22 DIAGNOSIS — Z5111 Encounter for antineoplastic chemotherapy: Secondary | ICD-10-CM | POA: Diagnosis not present

## 2019-02-22 DIAGNOSIS — C119 Malignant neoplasm of nasopharynx, unspecified: Secondary | ICD-10-CM

## 2019-02-22 MED ORDER — SODIUM CHLORIDE 0.9% FLUSH
10.0000 mL | Freq: Once | INTRAVENOUS | Status: AC | PRN
  Administered 2019-02-22: 10 mL

## 2019-02-22 MED ORDER — SODIUM CHLORIDE 0.9 % IV SOLN
Freq: Once | INTRAVENOUS | Status: AC
  Administered 2019-02-22: 11:00:00 via INTRAVENOUS
  Filled 2019-02-22: qty 1000

## 2019-02-22 MED ORDER — HEPARIN SOD (PORK) LOCK FLUSH 100 UNIT/ML IV SOLN
500.0000 [IU] | Freq: Once | INTRAVENOUS | Status: AC | PRN
  Administered 2019-02-22: 500 [IU]

## 2019-02-22 NOTE — Progress Notes (Signed)
Patient states he is feeling much better today. Hydration fluids given today per orders.  Patient tolerated it well without problems. Vitals stable and discharged home from clinic ambulatory. Follow up as scheduled.

## 2019-02-22 NOTE — Patient Instructions (Signed)
[facility]  Discharge Instructions:   _______________________________________________________________  Thank you for choosing [facility] at [facility] to provide your oncology and hematology care.  To afford each patient quality time with our providers, please arrive at least 15 minutes before your scheduled appointment.  You need to re-schedule your appointment if you arrive 10 or more minutes late.  We strive to give you quality time with our providers, and arriving late affects you and other patients whose appointments are after yours.  Also, if you no show three or more times for appointments you may be dismissed from the clinic.  Again, thank you for choosing [facility] at [facility]. Our hope is that these requests will allow you access to exceptional care and in a timely manner. _______________________________________________________________  If you have questions after your visit, please contact our office at [phone] between the hours of 8:30 a.m. and 5:00 p.m. Voicemails left after 4:30 p.m. will not be returned until the following business day. _______________________________________________________________  For prescription refill requests, have your pharmacy contact our office. _______________________________________________________________  Recommendations made by the consultant and any test results will be sent to your referring physician. _______________________________________________________________ 

## 2019-02-27 ENCOUNTER — Other Ambulatory Visit: Payer: Self-pay

## 2019-02-27 ENCOUNTER — Inpatient Hospital Stay (HOSPITAL_COMMUNITY): Payer: Medicare Other

## 2019-02-27 ENCOUNTER — Encounter (HOSPITAL_COMMUNITY): Payer: Self-pay | Admitting: Hematology

## 2019-02-27 ENCOUNTER — Inpatient Hospital Stay (HOSPITAL_BASED_OUTPATIENT_CLINIC_OR_DEPARTMENT_OTHER): Payer: Medicare Other | Admitting: Hematology

## 2019-02-27 VITALS — BP 122/67 | HR 86 | Temp 97.8°F | Resp 18

## 2019-02-27 DIAGNOSIS — Z5111 Encounter for antineoplastic chemotherapy: Secondary | ICD-10-CM | POA: Diagnosis not present

## 2019-02-27 DIAGNOSIS — G893 Neoplasm related pain (acute) (chronic): Secondary | ICD-10-CM | POA: Diagnosis not present

## 2019-02-27 DIAGNOSIS — C770 Secondary and unspecified malignant neoplasm of lymph nodes of head, face and neck: Secondary | ICD-10-CM

## 2019-02-27 DIAGNOSIS — C119 Malignant neoplasm of nasopharynx, unspecified: Secondary | ICD-10-CM | POA: Diagnosis not present

## 2019-02-27 DIAGNOSIS — Z95828 Presence of other vascular implants and grafts: Secondary | ICD-10-CM

## 2019-02-27 LAB — CBC WITH DIFFERENTIAL/PLATELET
Abs Immature Granulocytes: 0.02 10*3/uL (ref 0.00–0.07)
Basophils Absolute: 0.1 10*3/uL (ref 0.0–0.1)
Basophils Relative: 1 %
Eosinophils Absolute: 0.3 10*3/uL (ref 0.0–0.5)
Eosinophils Relative: 5 %
HCT: 36.9 % — ABNORMAL LOW (ref 39.0–52.0)
Hemoglobin: 12.1 g/dL — ABNORMAL LOW (ref 13.0–17.0)
Immature Granulocytes: 0 %
Lymphocytes Relative: 33 %
Lymphs Abs: 1.9 10*3/uL (ref 0.7–4.0)
MCH: 31.8 pg (ref 26.0–34.0)
MCHC: 32.8 g/dL (ref 30.0–36.0)
MCV: 96.9 fL (ref 80.0–100.0)
Monocytes Absolute: 0.4 10*3/uL (ref 0.1–1.0)
Monocytes Relative: 7 %
Neutro Abs: 3.1 10*3/uL (ref 1.7–7.7)
Neutrophils Relative %: 54 %
Platelets: 408 10*3/uL — ABNORMAL HIGH (ref 150–400)
RBC: 3.81 MIL/uL — ABNORMAL LOW (ref 4.22–5.81)
RDW: 14.7 % (ref 11.5–15.5)
WBC: 5.7 10*3/uL (ref 4.0–10.5)
nRBC: 0 % (ref 0.0–0.2)

## 2019-02-27 LAB — COMPREHENSIVE METABOLIC PANEL
ALT: 16 U/L (ref 0–44)
AST: 19 U/L (ref 15–41)
Albumin: 3.4 g/dL — ABNORMAL LOW (ref 3.5–5.0)
Alkaline Phosphatase: 131 U/L — ABNORMAL HIGH (ref 38–126)
Anion gap: 10 (ref 5–15)
BUN: 31 mg/dL — ABNORMAL HIGH (ref 6–20)
CO2: 26 mmol/L (ref 22–32)
Calcium: 9.3 mg/dL (ref 8.9–10.3)
Chloride: 102 mmol/L (ref 98–111)
Creatinine, Ser: 1.3 mg/dL — ABNORMAL HIGH (ref 0.61–1.24)
GFR calc Af Amer: >60 mL/min (ref >60)
GFR calc non Af Amer: >60 mL/min (ref >60)
Glucose, Bld: 111 mg/dL — ABNORMAL HIGH (ref 70–99)
Potassium: 3.8 mmol/L (ref 3.5–5.1)
Sodium: 138 mmol/L (ref 135–145)
Total Bilirubin: 0.3 mg/dL (ref 0.3–1.2)
Total Protein: 7.7 g/dL (ref 6.5–8.1)

## 2019-02-27 LAB — MAGNESIUM: Magnesium: 1.5 mg/dL — ABNORMAL LOW (ref 1.7–2.4)

## 2019-02-27 MED ORDER — SODIUM CHLORIDE 0.9% FLUSH
10.0000 mL | INTRAVENOUS | Status: DC | PRN
  Administered 2019-02-27: 10 mL
  Filled 2019-02-27: qty 10

## 2019-02-27 MED ORDER — SODIUM CHLORIDE 0.9 % IV SOLN
Freq: Once | INTRAVENOUS | Status: AC
  Administered 2019-02-27: 10:00:00 via INTRAVENOUS

## 2019-02-27 MED ORDER — SODIUM CHLORIDE 0.9 % IV SOLN: 8.0000 mg | Freq: Once | INTRAVENOUS | Status: DC

## 2019-02-27 MED ORDER — SODIUM CHLORIDE 0.9 % IV SOLN: 10.0000 mg | Freq: Once | INTRAVENOUS | Status: DC

## 2019-02-27 MED ORDER — HEPARIN SOD (PORK) LOCK FLUSH 100 UNIT/ML IV SOLN
500.0000 [IU] | Freq: Once | INTRAVENOUS | Status: AC | PRN
  Administered 2019-02-27: 500 [IU]

## 2019-02-27 MED ORDER — SODIUM CHLORIDE 0.9 % IV SOLN
Freq: Once | INTRAVENOUS | Status: AC
  Administered 2019-02-27: 10:00:00 via INTRAVENOUS
  Filled 2019-02-27: qty 1000

## 2019-02-27 MED ORDER — SODIUM CHLORIDE 0.9 % IV SOLN
Freq: Once | INTRAVENOUS | Status: AC
  Administered 2019-02-27: 8 mg via INTRAVENOUS
  Filled 2019-02-27: qty 4

## 2019-02-27 MED ORDER — SODIUM CHLORIDE 0.9 % IV SOLN
2000.0000 mg | Freq: Once | INTRAVENOUS | Status: AC
  Administered 2019-02-27: 2000 mg via INTRAVENOUS
  Filled 2019-02-27: qty 52.6

## 2019-02-27 MED ORDER — FAMOTIDINE 20 MG PO TABS: 20.0000 mg | ORAL_TABLET | Freq: Two times a day (BID) | ORAL | 2 refills | Status: AC

## 2019-02-27 NOTE — Patient Instructions (Addendum)
[facility] at [facility] Discharge Instructions  You were seen today by Dr. Katragadda. He went over your recent lab results. He will see you back in 2 weeks for labs, treatment and follow up.   Thank you for choosing  [facility] at [facility] to provide your oncology and hematology care.  To afford each patient quality time with our provider, please arrive at least 15 minutes before your scheduled appointment time.   If you have a lab appointment with the Cancer Center please come in thru the  Main Entrance and check in at the main information desk  You need to re-schedule your appointment should you arrive 10 or more minutes late.  We strive to give you quality time with our providers, and arriving late affects you and other patients whose appointments are after yours.  Also, if you no show three or more times for appointments you may be dismissed from the clinic at the providers discretion.     Again, thank you for choosing [facility].  Our hope is that these requests will decrease the amount of time that you wait before being seen by our physicians.       _____________________________________________________________  Should you have questions after your visit to [facility], please contact our office at [phone] between the hours of 8:00 a.m. and 4:30 p.m.  Voicemails left after 4:00 p.m. will not be returned until the following business day.  For prescription refill requests, have your pharmacy contact our office and allow 72 hours.    Cancer Center Support Programs:   > Cancer Support Group  2nd Tuesday of the month 1pm-2pm, Journey Room    
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
## 2019-02-27 NOTE — Progress Notes (Signed)
Patient for treatment today.  Stated nausea with poor control with medications.  No s/s of distress noted and oncologist notified.  Patient seen by Dr. Delton Coombes with lab review and magnesium 1.5 today with verbal order ok to treat today and hydration with mag and potassium.  Pharmacy notified.    Patient tolerated chemotherapy with no complaints voiced.  Port site clean and dry with no bruising or swelling noted at site.  No blood return noted before and after administration of chemotherapy and the patient denied pain with flush.  Port flushed easily.  Band aid applied.  Patient left ambulatory with VSS and no s/s of distress noted.

## 2019-02-27 NOTE — Progress Notes (Signed)
Port did not give blood today.  Flushed with no complaints voiced.  Labs peripheral stick.

## 2019-03-13 ENCOUNTER — Inpatient Hospital Stay (HOSPITAL_COMMUNITY): Payer: Medicare Other

## 2019-03-13 ENCOUNTER — Encounter (HOSPITAL_COMMUNITY): Payer: Self-pay | Admitting: *Deleted

## 2019-03-13 ENCOUNTER — Inpatient Hospital Stay (HOSPITAL_COMMUNITY): Payer: Medicare Other | Attending: Hematology | Admitting: Hematology

## 2019-03-13 NOTE — Progress Notes (Signed)
I talked to patient's wife, Edwena Blow and she states that patient didn't come for treatment today because he has no energy. He hasn't been eating or drinking much since his last treatment. She said that he is on the cough and sleeps all day. He is only up for a few minutes at a time and then goes back to sleep.  She said that he moans all night long and isn't sleeping much.  I asked her if she could bring him in for labs and fluids and she said that she isn't at home and he doesn't have access to his phone at the time.  I told her that as soon as she could get home to him to ask if he would be willing to come in and we would be glad to evaluate him.  She said that she will tell him and then let us know.

## 2019-03-14 ENCOUNTER — Ambulatory Visit (HOSPITAL_COMMUNITY): Payer: Medicare Other

## 2019-03-14 ENCOUNTER — Encounter (HOSPITAL_COMMUNITY): Payer: Self-pay | Admitting: *Deleted

## 2019-03-14 NOTE — Progress Notes (Signed)
Patient's wife called today stating that patient still hasn't got up off the couch but he did agree to come in for fluids. She states that his tongue is cracked he is so dry.  Appointment has been made for tomorrow morning for fluids and labs.

## 2019-03-15 ENCOUNTER — Ambulatory Visit (HOSPITAL_COMMUNITY): Payer: Medicare Other

## 2019-03-15 ENCOUNTER — Ambulatory Visit (HOSPITAL_COMMUNITY): Payer: Medicare Other | Admitting: Nurse Practitioner

## 2019-03-19 ENCOUNTER — Telehealth (HOSPITAL_COMMUNITY): Payer: Self-pay

## 2019-03-19 ENCOUNTER — Other Ambulatory Visit: Payer: Self-pay

## 2019-03-19 NOTE — Telephone Encounter (Signed)
Called the patient for the COVID screening.  The wife stated he will not be coming tomorrow for treatment due to weakness and fatigue.  He does have a fair appetite.  Denied SOB and any other complaints.  Instructed the wife the oncologist would be notified and to see if a regular oncology appointment needs to be scheduled with understanding verbalized.

## 2019-03-20 ENCOUNTER — Other Ambulatory Visit (HOSPITAL_COMMUNITY): Payer: Medicare Other

## 2019-03-20 ENCOUNTER — Ambulatory Visit (HOSPITAL_COMMUNITY): Payer: Medicare Other

## 2019-03-26 ENCOUNTER — Other Ambulatory Visit (HOSPITAL_COMMUNITY): Payer: Self-pay | Admitting: Hematology

## 2019-03-27 ENCOUNTER — Other Ambulatory Visit: Payer: Self-pay

## 2019-03-28 ENCOUNTER — Inpatient Hospital Stay (HOSPITAL_COMMUNITY): Payer: Medicare Other | Admitting: Hematology

## 2019-03-28 ENCOUNTER — Other Ambulatory Visit (HOSPITAL_COMMUNITY): Payer: Medicare Other

## 2019-03-28 ENCOUNTER — Ambulatory Visit (HOSPITAL_COMMUNITY): Payer: Medicare Other

## 2019-04-01 ENCOUNTER — Encounter (HOSPITAL_COMMUNITY): Payer: Self-pay | Admitting: *Deleted

## 2019-04-01 NOTE — Progress Notes (Signed)
Patient's SO called today wanting to reschedule his treatment.  She said that he is in a good place right now and wants to have his chemo. I explained to her that due to multiple no show appointments we are no longer able to see patient.  I explained that we would be glad to get him referred to another oncologist of his choice.  She said she would get him to call us back with his decision.  I provided her with our schedulers number so they can get him referred to the appropriate place.

## 2019-04-02 ENCOUNTER — Encounter (HOSPITAL_COMMUNITY): Payer: Self-pay | Admitting: Lab

## 2019-04-02 NOTE — Progress Notes (Unsigned)
Referral sent to [facility]. Records faxed on 6/23

## 2020-01-01 IMAGING — MR MR NECK SOFT TISSUE ONLY WO/W CM
4 of 9 series · 20 of 48 positions shown · IV contrast (Gadavist)
Comparison: PET-CT 05/30/2018.  Neck CT 06/21/2018

CLINICAL DATA: History of nasopharyngeal carcinoma. Evaluate skull
base.

EXAM:
MRI OF THE NECK WITH CONTRAST
TECHNIQUE: Multiplanar, multisequence MR imaging was performed following the
administration of intravenous contrast.
CONTRAST:  9 cc Gadavist intravenous

[Series 6: [id number]_stir_cor · coronal · 3.5mm · 1.00mm/px · 3 of 40 slices shown]
[im 1/40]
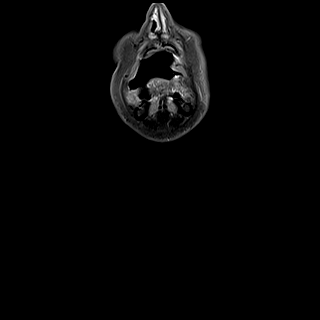
[im 27/40]
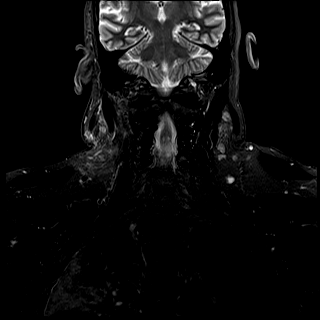
[im 40/40]
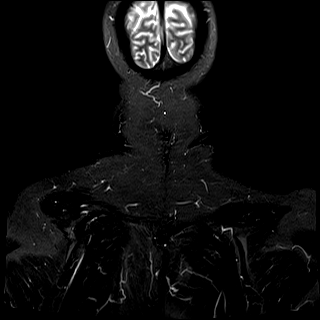

[Series 11: T1 · axial · 4.0mm · 0.55mm/px · z∈[-125,+117]mm · 6 of 45 slices shown]
[im 1/45]
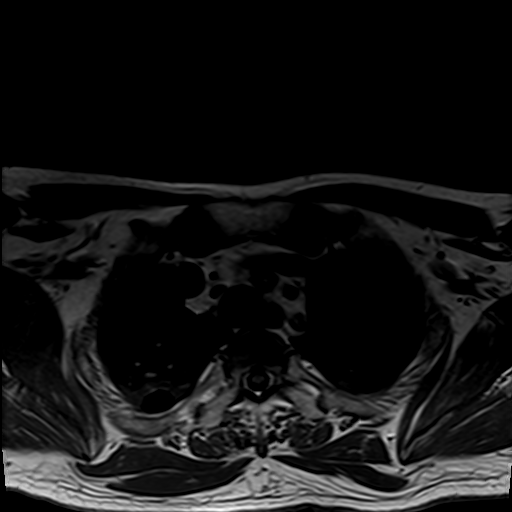
[im 9/45]
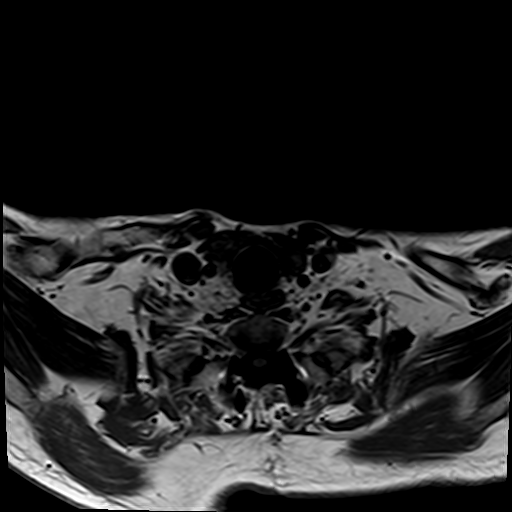
[im 18/45]
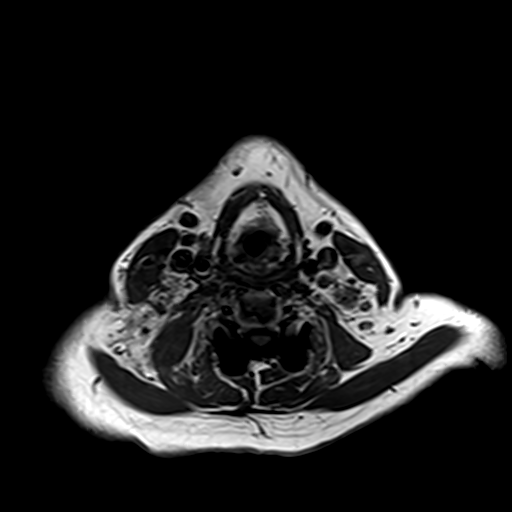
[im 27/45]
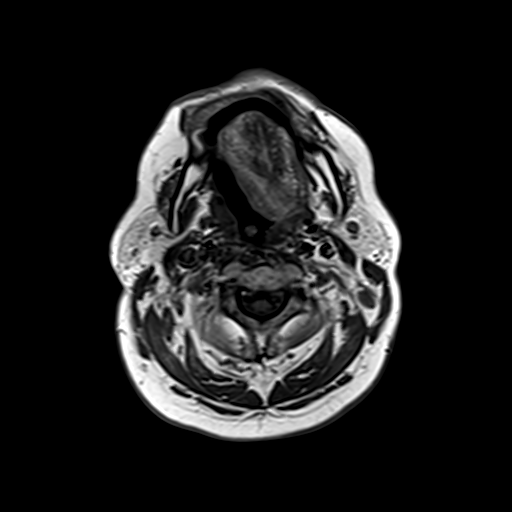
[im 36/45]
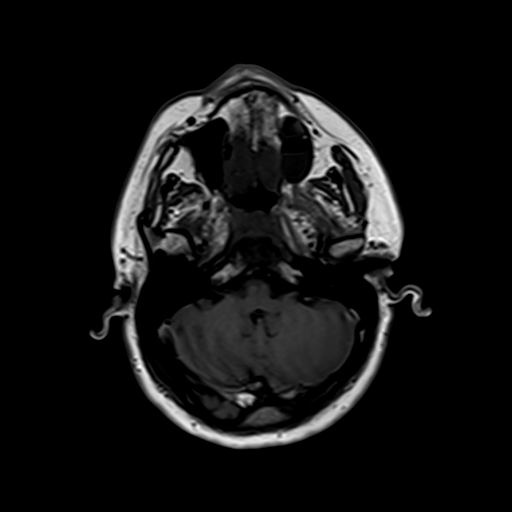
[im 45/45]
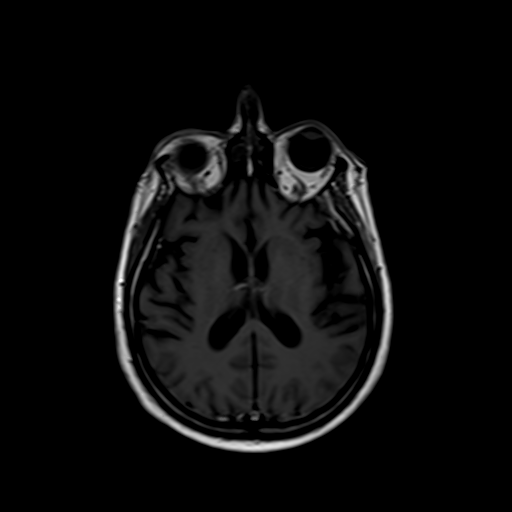

[Series 17: T1 post-contrast · axial · 4.0mm · 0.55mm/px · z∈[-125,+117]mm · 6 of 45 slices shown]
[im 1/45]
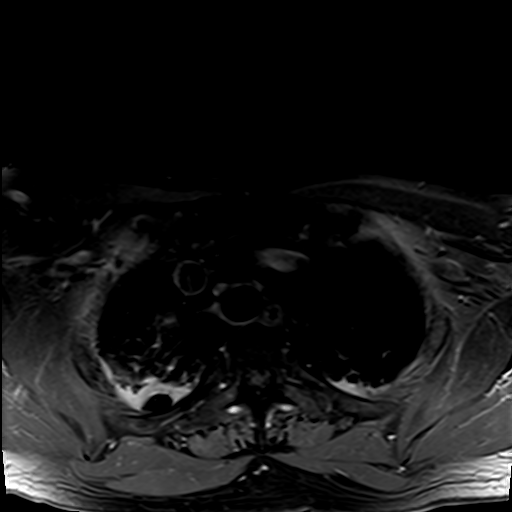
[im 9/45]
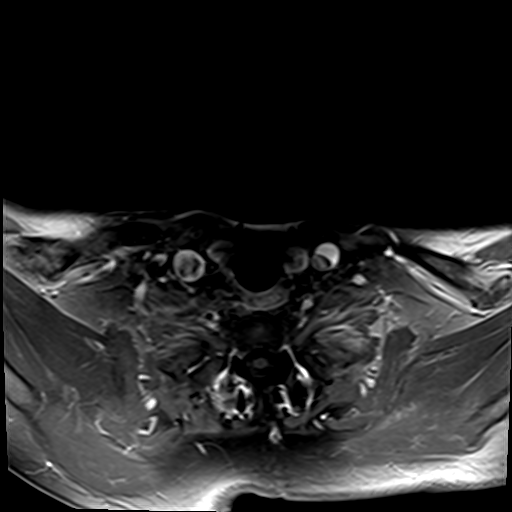
[im 18/45]
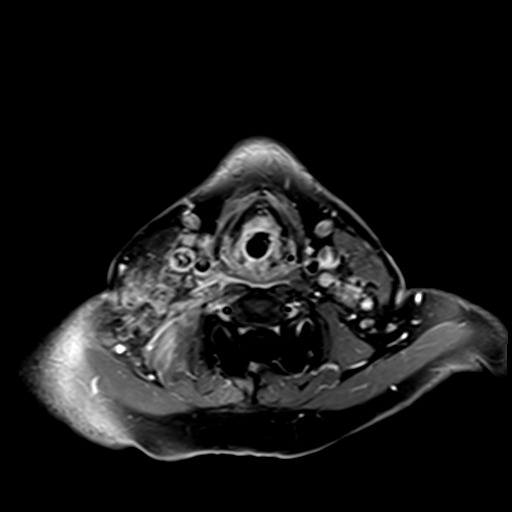
[im 27/45]
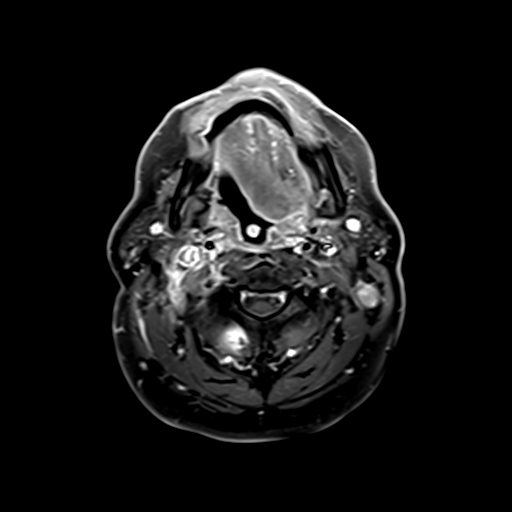
[im 36/45]
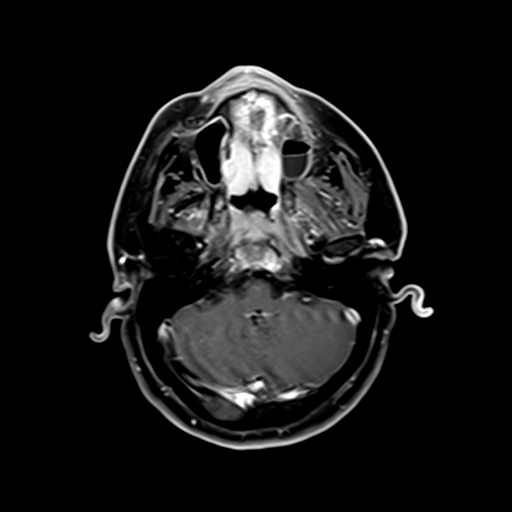
[im 45/45]
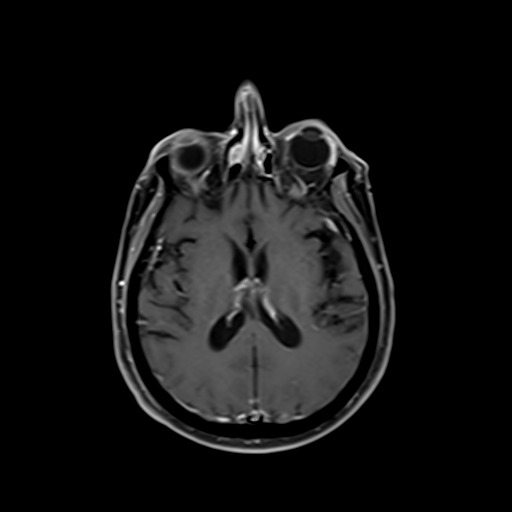

[Series 19: T1 fat-sat post-contrast · sagittal · 3.5mm · 0.59mm/px · 5 of 40 slices shown]
[im 1/40]
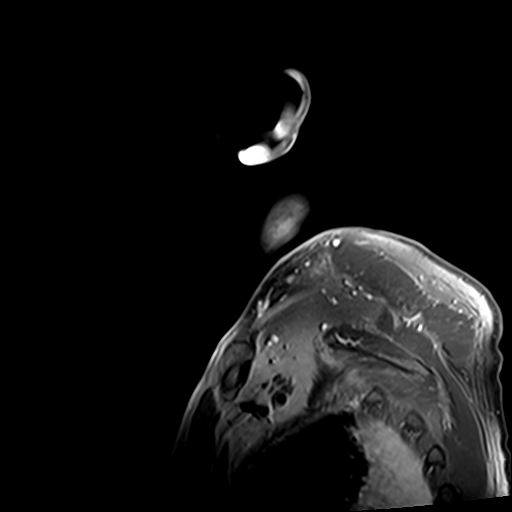
[im 10/40]
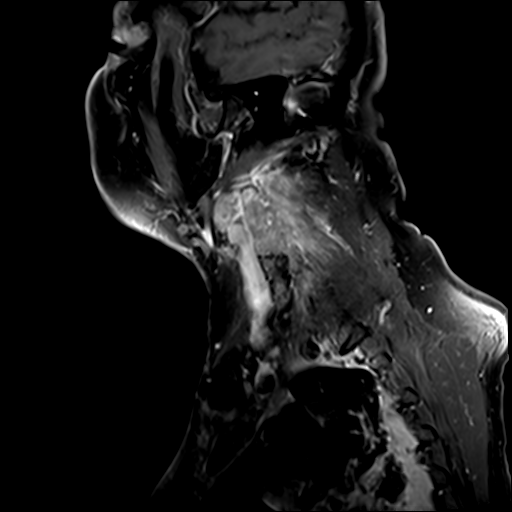
[im 20/40]
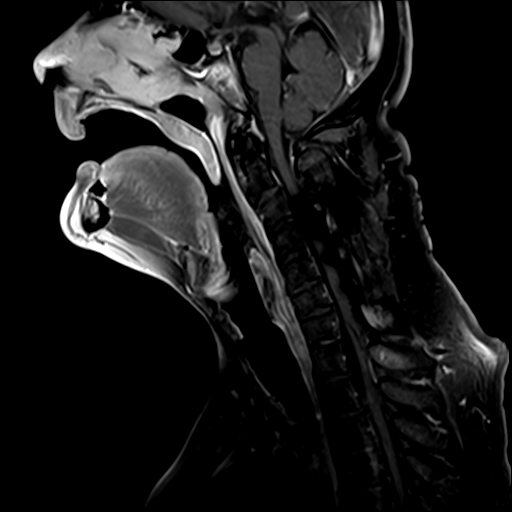
[im 30/40]
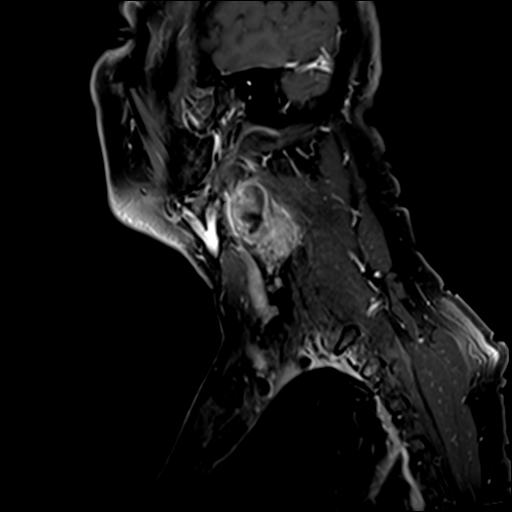
[im 40/40]
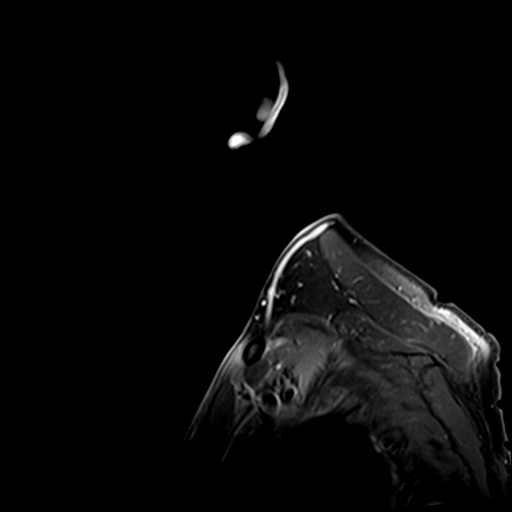

[20 of 48 positions shown; findings below may reference images not displayed]

FINDINGS: Pharynx and larynx: Known nasopharyngeal mass, right eccentric.
Subjacent prevertebral musculature has abnormal signal consistent
with invasion. On sagittal T1 weighted imaging there is clear
cortical erosion ([DATE]) and there is extensive enhancement within
the clivus marrow space, consistent with tumor invasion. No
asymmetric enhancement along the cranial nerves. Symmetric normal
signal at the cavernous sinuses and Meckel's cave. No lateral
extension to the carotid sheath or pterygoids.

Salivary glands: Negative

Thyroid: Negative

Lymph nodes: Bilateral jugular lymphadenopathy, multiple with
cavitation. Particularly bulky deposit on the right with
extracapsular tumor invading regional musculature. At the same
level, left-sided nodal deposit also has lobulated and strandy
margins consistent with extracapsular tumor. Progressive inferior
extent of adenopathy. A cavitary node in the left posterior triangle
measures 13 mm and an indistinct posterior triangle node on the
right, contacting the levator scapulae, also measures 13 mm.

Vascular: Adenopathy is in close proximity to the right carotid
sheath.

Limited intracranial: No visible intracranial tumor extension.

Visualized orbits: Negative

Mastoids and visualized paranasal sinuses: Congested appearance of
nasal mucosa. Retention cysts in the maxillary sinuses.

Skeleton: Clival invasion as noted above. Extensive cervical spine
fusion and decompressive laminectomy.

Upper chest: Right more than left upper lobe lung opacity. This is
consistent with atelectasis on the left, but could be consolidation
on the right given the extent.
IMPRESSION: 1. The patient's nasopharyngeal carcinoma invades the clivus. No
evident intracranial spread or perineural tumor invasion.
2. Bilateral cervical nodal metastases with extracapsular tumor on
both sides. There is progressive nodal disease compared to PET-CT
05/30/2018, with abnormal nodes now seen inferior to the hyoid on
both sides.
3. Bilateral upper lobe lung atelectasis in this intubated patient,
but airspace disease is not excluded on the right. Please correlate
for pneumonia symptoms.

## 2020-07-07 ENCOUNTER — Other Ambulatory Visit (HOSPITAL_COMMUNITY): Payer: Self-pay | Admitting: Family Medicine

## 2020-07-07 DIAGNOSIS — M1990 Unspecified osteoarthritis, unspecified site: Secondary | ICD-10-CM

## 2020-07-07 DIAGNOSIS — M25551 Pain in right hip: Secondary | ICD-10-CM

## 2020-07-16 ENCOUNTER — Other Ambulatory Visit: Payer: Self-pay

## 2020-07-16 ENCOUNTER — Other Ambulatory Visit (HOSPITAL_COMMUNITY): Payer: Self-pay | Admitting: Family Medicine

## 2020-07-16 ENCOUNTER — Ambulatory Visit (HOSPITAL_COMMUNITY)
Admission: RE | Admit: 2020-07-16 | Discharge: 2020-07-16 | Disposition: A | Discharge status: Home / Self Care | Payer: Medicare Other | Source: Ambulatory Visit | Attending: Family Medicine | Admitting: Family Medicine

## 2020-07-16 DIAGNOSIS — M25551 Pain in right hip: Secondary | ICD-10-CM | POA: Diagnosis present

## 2020-07-16 DIAGNOSIS — M1990 Unspecified osteoarthritis, unspecified site: Secondary | ICD-10-CM | POA: Insufficient documentation

## 2020-09-22 ENCOUNTER — Other Ambulatory Visit: Payer: Self-pay

## 2020-09-22 ENCOUNTER — Ambulatory Visit: Payer: Medicare Other

## 2020-09-22 ENCOUNTER — Ambulatory Visit (INDEPENDENT_AMBULATORY_CARE_PROVIDER_SITE_OTHER): Payer: Medicare Other | Admitting: Orthopaedic Surgery

## 2020-09-22 ENCOUNTER — Encounter: Payer: Self-pay | Admitting: Orthopaedic Surgery

## 2020-09-22 VITALS — BP 135/58 | HR 84 | Ht 72.0 in | Wt 169.2 lb

## 2020-09-22 DIAGNOSIS — M25551 Pain in right hip: Secondary | ICD-10-CM

## 2020-09-22 DIAGNOSIS — Z95828 Presence of other vascular implants and grafts: Secondary | ICD-10-CM | POA: Diagnosis not present

## 2020-09-22 DIAGNOSIS — C119 Malignant neoplasm of nasopharynx, unspecified: Secondary | ICD-10-CM

## 2020-09-22 DIAGNOSIS — Z9081 Acquired absence of spleen: Secondary | ICD-10-CM | POA: Diagnosis not present

## 2020-09-22 NOTE — Progress Notes (Signed)
Subjective:    Patient ID: Zachary Dickerson., male    DOB: 1961/01/30, 59 y.o.   MRN: [id number]  HPI He has a long history of right hip pain.  About 4 and a half years ago he had an injury that needed surgery for fracture of the right hip.  He had a gamma nail inserted by Dr. Cheri Rous of [facility].  He is no longer in practice there.  The patient has had worsening pain of the right hip over the last twelve to eighteen months.  He saw Dr. Lorriane Shire in October and had x-rays 07-16-20 which showed the gamma nail in place and avascular necrosis of the right hip.  Referral was just made to be seen and he was put on schedule right away.  He uses a cane to ambulate.  He has pain of the right hip most of the time.  To complicate matters further he is being actively treated for nasopharyngeal cancer.  He cannot have any surgery at this time.  He is here to see what can be done for the hip.  He thinks he needs a shoe lift on the right.  I ordered leg length films but our X-rays unit cannot do secondary to construction problems that removed the channel locks on the floor to digitally connect to the machine so that we can digitally get excellent leg length films.  I told the patient of the problem.  He is too tired to go to the hospital now.  We have schedule him for the leg length films next week.  He can see Dr. Aline Brochure or Dr. Amedeo Kinsman after that or he can wait three weeks to see me.  The patient understands.   Review of Systems  Constitutional: Positive for activity change.  HENT: Positive for congestion and sore throat.   Eyes:       Blind right eye  Respiratory: Positive for shortness of breath.   Musculoskeletal: Positive for arthralgias, gait problem, joint swelling, myalgias and neck pain.  Neurological: Positive for headaches.  Psychiatric/Behavioral: The patient is nervous/anxious.    For Review of Systems, all other systems reviewed and are negative.  The following is a summary of the  past history medically, past history surgically, known current medicines, social history and family history.  This information is gathered electronically by the computer from prior information and documentation.  I review this each visit and have found including this information at this point in the chart is beneficial and informative.   Past Medical History:  Diagnosis Date  . Anxiety   . Arthritis    neck, back  . Cancer ([place])   . Chronic narcotic use   . Chronic neck pain   . COPD (chronic obstructive pulmonary disease) ([place])   . Headache   . Panic attack   . Port-A-Cath in place 02/19/2019  . Smoker     Past Surgical History:  Procedure Laterality Date  . FRACTURE SURGERY    . NASOPHARYNGEAL BIOPSY N/A 05/23/2018   Procedure: NASOPHARYNGEAL BIOPSY AND FROZEN SECTION;  Surgeon: Leta Baptist, MD;  Location: [facility];  Service: ENT;  Laterality: N/A;  . NECK SURGERY    . NECK SURGERY    . PORTACATH PLACEMENT Left 07/16/2018   Procedure: INSERTION PORT-A-CATH;  Surgeon: Aviva Signs, MD;  Location: [facility];  Service: General;  Laterality: Left;  . RADIOLOGY WITH ANESTHESIA N/A 07/05/2018   Procedure: MRI OF NECK WITH AND WITHOUT CONTRAST WITH ANESTHESIA;  Surgeon: Radiologist, Medication, MD;  Location: [facility];  Service: Radiology;  Laterality: N/A;  . SPLENECTOMY, TOTAL      Current Outpatient Medications on File Prior to Visit  Medication Sig Dispense Refill  . albuterol (PROVENTIL) (2.5 MG/3ML) 0.083% nebulizer solution Take 2.5 mg by nebulization every 6 (six) hours as needed for wheezing or shortness of breath.     . ALPRAZolam (XANAX) 0.5 MG tablet Take 0.5 mg by mouth 3 (three) times daily as needed for anxiety.    . Aspirin-Salicylamide-Caffeine (BC HEADACHE POWDER PO) Take 1 packet by mouth daily.    . buprenorphine (SUBUTEX) 8 MG SUBL SL tablet Place 8 mg under the tongue 3 (three) times daily before meals.    Marland Kitchen CISPLATIN IV Inject into the vein. Infuse Day 1, 8  every 21 days    . famotidine (PEPCID) 20 MG tablet Take 1 tablet (20 mg total) by mouth 2 (two) times daily. 60 tablet 2  . gabapentin (NEURONTIN) 300 MG capsule Take 300 mg by mouth 3 (three) times daily as needed (for hand pain).     . Gemcitabine HCl (GEMZAR IV) Inject into the vein. Infuse Day 1, 8 every 21 days    . lidocaine-prilocaine (EMLA) cream Apply small amount to port a cath site and cover with plastic wrap one hour prior to chemotherapy appointments 30 g 3  . prochlorperazine (COMPAZINE) 10 MG tablet Take 1 tablet (10 mg total) by mouth every 6 (six) hours as needed (Nausea or vomiting). 30 tablet 1  . QUEtiapine (SEROQUEL) 300 MG tablet Take 400 mg by mouth at bedtime.      No current facility-administered medications on file prior to visit.    Social History   Socioeconomic History  . Marital status: Married    Spouse name: Not on file  . Number of children: 2  . Years of education: Not on file  . Highest education level: Not on file  Occupational History    Comment: travel painter     Comment: [employer]    Comment: [employer]    Comment: tobacco farmer growing up  Tobacco Use  . Smoking status: Current Every Day Smoker    Packs/day: 0.25    Years: 42.00    Pack years: 10.50    Types: Cigarettes  . Smokeless tobacco: Never Used  Vaping Use  . Vaping Use: Never used  Substance and Sexual Activity  . Alcohol use: No  . Drug use: Yes    Types: Other-see comments    Comment: history of narcotic abuse  . Sexual activity: Yes    Birth control/protection: None  Other Topics Concern  . Not on file  Social History Narrative  . Not on file   Social Determinants of Health   Financial Resource Strain: Not on file  Food Insecurity: Not on file  Transportation Needs: Not on file  Physical Activity: Not on file  Stress: Not on file  Social Connections: Not on file  Intimate Partner Violence: Not on file    Family History  Problem Relation Age of Onset  .  Emphysema Mother   . Heart attack Father   . Emphysema Sister   . Heart disease Sister   . Hypertension Sister   . COPD Sister   . Heart attack Brother   . Emphysema Brother   . Heart disease Brother   . Hypertension Brother   . Heart attack Paternal Aunt   . Hypertension Paternal Aunt   .  Diabetes Paternal Aunt   . Heart disease Paternal Aunt   . Heart attack Paternal Uncle   . Hypertension Paternal Uncle   . Diabetes Paternal Uncle   . Heart disease Paternal Uncle   . Leukemia Maternal Grandmother   . Emphysema Maternal Grandfather   . Stroke Sister   . Heart disease Sister   . Emphysema Sister   . COPD Sister     BP (!) 135/58   Pulse 84   Ht 6' (1.829 m)   Wt 169 lb 4 oz (76.8 kg)   BMI 22.95 kg/m   Body mass index is 22.95 kg/m.     Objective:   Physical Exam Vitals and nursing note reviewed. Exam conducted with a chaperone present.  Constitutional:      Appearance: He is well-developed and well-nourished.  HENT:     Head: Normocephalic and atraumatic.  Eyes:     Extraocular Movements: EOM normal.     Conjunctiva/sclera: Conjunctivae normal.     Pupils: Pupils are equal, round, and reactive to light.  Cardiovascular:     Rate and Rhythm: Normal rate and regular rhythm.     Pulses: Intact distal pulses.  Pulmonary:     Effort: Pulmonary effort is normal.  Abdominal:     Palpations: Abdomen is soft.  Musculoskeletal:     Cervical back: Normal range of motion and neck supple.       Legs:  Skin:    General: Skin is warm and dry.  Neurological:     Mental Status: He is alert and oriented to person, place, and time.     Cranial Nerves: No cranial nerve deficit.     Motor: No abnormal muscle tone.     Coordination: Coordination normal.     Deep Tendon Reflexes: Reflexes are normal and symmetric. Reflexes normal.  Psychiatric:        Mood and Affect: Mood and affect normal.        Behavior: Behavior normal.        Thought Content: Thought content  normal.        Judgment: Judgment normal.           Assessment & Plan:   Encounter Diagnoses  Name Primary?  . Pain in right hip Yes  . Nasopharyngeal carcinoma ([place])   . Port-A-Cath in place   . History of splenectomy    To get leg length films at hospital.  Electronically Signed Sanjuana Kava, MD 12/14/202111:31 AM

## 2020-09-22 NOTE — Patient Instructions (Signed)

## 2020-09-22 NOTE — Progress Notes (Signed)
H

## 2020-09-25 ENCOUNTER — Encounter (INDEPENDENT_AMBULATORY_CARE_PROVIDER_SITE_OTHER): Payer: Self-pay | Admitting: *Deleted

## 2020-09-28 ENCOUNTER — Ambulatory Visit (HOSPITAL_COMMUNITY)
Admission: RE | Admit: 2020-09-28 | Discharge: 2020-09-28 | Disposition: A | Discharge status: Home / Self Care | Payer: Medicare Other | Source: Ambulatory Visit | Attending: Orthopaedic Surgery | Admitting: Orthopaedic Surgery

## 2020-09-28 ENCOUNTER — Other Ambulatory Visit: Payer: Self-pay

## 2020-09-28 DIAGNOSIS — M25551 Pain in right hip: Secondary | ICD-10-CM | POA: Insufficient documentation

## 2020-09-29 ENCOUNTER — Encounter: Payer: Self-pay | Admitting: Orthopedic Surgery

## 2020-09-29 ENCOUNTER — Ambulatory Visit: Payer: Medicare Other | Admitting: Orthopedic Surgery

## 2020-09-29 VITALS — BP 122/65 | HR 118 | Ht 72.0 in | Wt 170.0 lb

## 2020-09-29 DIAGNOSIS — M2170 Unequal limb length (acquired), unspecified site: Secondary | ICD-10-CM

## 2020-09-29 NOTE — Progress Notes (Signed)
[facility] Clinic Return  Assessment: Zachary Dickerson. is a 59 y.o. male with the following: Leg length discrepancy, 3.5 cm L>R based on bilateral full length XR   Plan: Mr. Maese continues have right hip and right leg pain, with a noticeable limp.  He requires a cane to assist with ambulation.  He had recent full-length bilateral lower extremity x-rays which demonstrates a 3.5 cm leg length discrepancy.  His right leg is shorter than his left.  I have advised him to consider using a shoe lift on the right side.  Based on the leg length discrepancy, to achieve full resolution of the discrepancy, he would most likely require a shoe lift that is built on the exterior of his shoe, which would require custom shoes.  He could potentially try some over-the-counter shoe lifts, but would be limited in the overall height.  He was given a prescription that he can take for custom fitted shoe lifts, or consider over-the-counter shoe lifts.  I do believe that this will help with his gait, which would ultimately improve his symptoms.  All questions were answered, and he is amenable to this plan.  No follow-up needed at this time.    Body mass index is 23.06 kg/m.  Follow-up: Return if symptoms worsen or fail to improve.   Subjective:  Chief Complaint  Patient presents with  . Hip Pain    Rt side hip pain f/u from seeing Dr. Luna Glasgow.     History of Present Illness: Zachary Dickerson. is a 59 y.o. male who presents for evaluation of his right hip.  Briefly, he was previously evaluated by Dr. Luna Glasgow, who recommended full-length lower extremity x-rays.  At that time in clinic, we were unable to complete the x-rays, he elected to have them scheduled as an outpatient at the hospital.  X-rays were completed yesterday, and returns today for follow-up.  He continues have pain in his right hip.  He uses a cane to assist with ambulation.  Of note, he is undergoing treatments for nasopharyngeal cancer, and is not  interested in surgery at this time.  Review of Systems: No fevers or chills No numbness or tingling No bowel or bladder dysfunction No GI distress No headaches    Objective: BP 122/65   Pulse (!) 118   Ht 6' (1.829 m)   Wt 170 lb (77.1 kg)   BMI 23.06 kg/m   Physical Exam:  Alert and oriented, no acute distress. Uses a cane to assist with ambulation. When he rests his right foot flat on the floor, he has a noticeable bend in his left knee.  If he tries to ambulate without a bend in his left knee, he does not get to a plantigrade position.  IMAGING: I personally reviewed the following images:  Full-length bilateral lower extremity x-rays were obtained and demonstrates a 3.5 cm leg length discrepancy, with the right being shorter than the left.  Right hip cephalomedullary nail in good position without significant subsidence or hardware failure.  The right hip is healed with decreased head neck offset.    Mordecai Rasmussen, MD 09/29/2020 3:00 PM

## 2020-11-25 DIAGNOSIS — C329 Malignant neoplasm of larynx, unspecified: Secondary | ICD-10-CM | POA: Diagnosis not present

## 2020-11-26 DIAGNOSIS — C787 Secondary malignant neoplasm of liver and intrahepatic bile duct: Secondary | ICD-10-CM | POA: Diagnosis not present

## 2020-11-26 DIAGNOSIS — R400 Somnolence: Secondary | ICD-10-CM | POA: Diagnosis not present

## 2020-11-26 DIAGNOSIS — C119 Malignant neoplasm of nasopharynx, unspecified: Secondary | ICD-10-CM | POA: Diagnosis not present

## 2020-11-26 DIAGNOSIS — Z09 Encounter for follow-up examination after completed treatment for conditions other than malignant neoplasm: Secondary | ICD-10-CM | POA: Diagnosis not present

## 2020-11-26 DIAGNOSIS — Z7189 Other specified counseling: Secondary | ICD-10-CM | POA: Diagnosis not present

## 2021-01-07 ENCOUNTER — Ambulatory Visit (INDEPENDENT_AMBULATORY_CARE_PROVIDER_SITE_OTHER): Payer: Medicare Other | Admitting: Gastroenterology

## 2021-03-10 DEATH — deceased

## 2022-05-02 ENCOUNTER — Other Ambulatory Visit: Payer: Self-pay

## 2022-12-08 ENCOUNTER — Encounter: Payer: Self-pay | Admitting: Radiology

## 2024-05-23 ENCOUNTER — Other Ambulatory Visit: Payer: Self-pay | Admitting: *Deleted
